# Patient Record
Sex: Male | Born: 1940 | Race: Black or African American | Hispanic: No | Smoking: Current every day smoker
Health system: Southern US, Community
[De-identification: ages and names within clinical notes are randomized; demographics above are authoritative.]

## PROBLEM LIST (undated history)

## (undated) DIAGNOSIS — I10 Essential (primary) hypertension: Secondary | ICD-10-CM

## (undated) DIAGNOSIS — E119 Type 2 diabetes mellitus without complications: Secondary | ICD-10-CM

## (undated) DIAGNOSIS — N289 Disorder of kidney and ureter, unspecified: Secondary | ICD-10-CM

## (undated) HISTORY — PX: BELOW KNEE LEG AMPUTATION: SUR23

## (undated) HISTORY — PX: NEPHRECTOMY TRANSPLANTED ORGAN: SUR880

---

## 1999-04-11 ENCOUNTER — Encounter: Payer: Self-pay | Admitting: Emergency Medicine

## 1999-04-12 ENCOUNTER — Inpatient Hospital Stay (HOSPITAL_COMMUNITY): Admission: EM | Admit: 1999-04-12 | Discharge: 1999-04-12 | Payer: Self-pay | Admitting: Emergency Medicine

## 1999-04-23 ENCOUNTER — Encounter: Admission: RE | Admit: 1999-04-23 | Discharge: 1999-04-23 | Payer: Self-pay | Admitting: Family Medicine

## 2010-05-25 ENCOUNTER — Ambulatory Visit: Payer: Self-pay | Admitting: Diagnostic Radiology

## 2010-05-25 ENCOUNTER — Encounter: Payer: Self-pay | Admitting: Emergency Medicine

## 2010-05-25 ENCOUNTER — Inpatient Hospital Stay (HOSPITAL_COMMUNITY): Admission: AD | Admit: 2010-05-25 | Discharge: 2010-06-01 | Payer: Self-pay | Admitting: Internal Medicine

## 2010-05-26 ENCOUNTER — Encounter (INDEPENDENT_AMBULATORY_CARE_PROVIDER_SITE_OTHER): Payer: Self-pay | Admitting: Internal Medicine

## 2010-05-29 ENCOUNTER — Encounter (INDEPENDENT_AMBULATORY_CARE_PROVIDER_SITE_OTHER): Payer: Self-pay | Admitting: Internal Medicine

## 2010-06-01 ENCOUNTER — Ambulatory Visit: Payer: Self-pay | Admitting: Vascular Surgery

## 2010-06-08 ENCOUNTER — Ambulatory Visit (HOSPITAL_COMMUNITY)
Admission: RE | Admit: 2010-06-08 | Discharge: 2010-06-08 | Payer: Self-pay | Source: Home / Self Care | Admitting: Vascular Surgery

## 2010-06-13 ENCOUNTER — Ambulatory Visit (HOSPITAL_COMMUNITY)
Admission: RE | Admit: 2010-06-13 | Discharge: 2010-06-13 | Payer: Self-pay | Source: Home / Self Care | Attending: Vascular Surgery | Admitting: Vascular Surgery

## 2010-06-18 ENCOUNTER — Ambulatory Visit: Payer: Self-pay | Admitting: Surgery

## 2010-06-21 ENCOUNTER — Ambulatory Visit: Payer: Self-pay | Admitting: Vascular Surgery

## 2010-06-28 ENCOUNTER — Ambulatory Visit: Payer: Self-pay | Admitting: Vascular Surgery

## 2010-07-19 ENCOUNTER — Ambulatory Visit
Admission: RE | Admit: 2010-07-19 | Discharge: 2010-07-19 | Payer: Self-pay | Source: Home / Self Care | Attending: Vascular Surgery | Admitting: Vascular Surgery

## 2010-07-24 ENCOUNTER — Ambulatory Visit (HOSPITAL_COMMUNITY)
Admission: RE | Admit: 2010-07-24 | Discharge: 2010-07-24 | Payer: Self-pay | Source: Home / Self Care | Attending: Vascular Surgery | Admitting: Vascular Surgery

## 2010-07-25 LAB — POCT I-STAT 4, (NA,K, GLUC, HGB,HCT)
Glucose, Bld: 329 mg/dL — ABNORMAL HIGH (ref 70–99)
HCT: 39 % (ref 39.0–52.0)
Hemoglobin: 13.3 g/dL (ref 13.0–17.0)
Potassium: 4 mEq/L (ref 3.5–5.1)
Sodium: 138 mEq/L (ref 135–145)

## 2010-07-25 LAB — GLUCOSE, CAPILLARY
Glucose-Capillary: 295 mg/dL — ABNORMAL HIGH (ref 70–99)
Glucose-Capillary: 236 mg/dL — ABNORMAL HIGH (ref 70–99)
Glucose-Capillary: 221 mg/dL — ABNORMAL HIGH (ref 70–99)

## 2010-07-25 LAB — SURGICAL PCR SCREEN
MRSA, PCR: NEGATIVE
Staphylococcus aureus: NEGATIVE

## 2010-08-09 ENCOUNTER — Ambulatory Visit: Admit: 2010-08-09 | Payer: Self-pay | Admitting: Vascular Surgery

## 2010-08-09 ENCOUNTER — Ambulatory Visit (INDEPENDENT_AMBULATORY_CARE_PROVIDER_SITE_OTHER): Payer: No Typology Code available for payment source | Admitting: Vascular Surgery

## 2010-08-09 ENCOUNTER — Ambulatory Visit: Payer: Self-pay | Admitting: Vascular Surgery

## 2010-08-09 DIAGNOSIS — I739 Peripheral vascular disease, unspecified: Secondary | ICD-10-CM

## 2010-08-10 NOTE — Op Note (Signed)
  NAME:  RIDGELY, ANASTACIO NO.:  [id number]  MEDICAL RECORD NO.:  [id number]          PATIENT TYPE:  AMB  LOCATION:  SDS                          FACILITY:  [facility]  PHYSICIAN:  [name]. Lashanta Elbe, MD  DATE OF BIRTH:  12-28-1940  DATE OF PROCEDURE:  07/24/2010 DATE OF DISCHARGE:  07/24/2010                              OPERATIVE REPORT   PROCEDURES: 1. 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 2. Completion amputation of fourth metatarsal left foot with foot     debridement.  PREOPERATIVE DIAGNOSIS:  Gangrene left foot with poorly-healing wound.  POSTOPERATIVE DIAGNOSIS:  Gangrene left foot with poorly-healing wound.  ANESTHESIA:  Spinal.  ASSISTANT:  Nurse.  OPERATIVE FINDINGS:  Necrotic tissue, left foot.  SPECIMENS:  Toes 2 and 3 left foot.  OPERATIVE DETAILS:  After obtaining informed consent, the patient was taken to the operating room.  The patient was placed in a supine position on the operating table.  After placement of a spinal anesthetic by the Anesthesia Team, the patient was placed in a supine position on the operating room table.  The patient's entire left foot was prepped and draped in usual sterile fashion.  An elliptical incision was made encompassing the second and third toes right at the proximal metatarsophalangeal junction.  The incision was extended through the joint space and the toe was completely transected.  These were passed off the table as a specimen.  The metatarsal was then transected approximately midshaft on both of these toes to get back to good decent bone tissue and a well-perfused portion of the foot.  There was good bleeding tissue from this location.  There was also still necrotic bone in the wound from the old fourth metatarsal and this was removed with rongeurs.  All the dead necrotic tissue was removed completely until there was good bleeding tissue.  At this point, the wound was thoroughly irrigated  with normal saline solution.  The deep layers of the foot were used to cover the bony tissues using interrupted 3-0 nylon sutures.  Skin was left open to be packed.  The patient tolerated the procedure well and there were no complications.  Sponge and needle counts were correct at the end of the case.  The patient was taken to the recovery room in a stable condition.     [name]. Eryn Marandola, MD     CEF/MEDQ  D:  07/24/2010  T:  07/25/2010  Job:  [id number]  Electronically Signed by Fabienne Bruns MD on 08/10/2010 01:06:56 PM

## 2010-08-20 NOTE — Assessment & Plan Note (Signed)
OFFICE VISIT  Jesus Estrada, Jesus Estrada DOB:  [birth year]                                       08/09/2010 EAVWU#:[id number]  The patient returns for follow-up today.  He recently underwent completion transmetatarsal amputation on January 17th and returns today for further follow-up.  He states that he did not have any problems but apparently his home health nurse or family thinks he may have fallen and injured his transmet recently.  He states overall he feels well.  PHYSICAL EXAMINATION:  Blood pressure is 141/74 in the left arm, heart rate is 98, temperature is 99.7.  There is a large amount of fibrinous debris in the left foot.  There is mild edema. The left foot was debrided back to healthy bleeding tissue again today.  There were no obvious pus pockets.  There is no erythema in the foot.  The patient's transmetatarsal amputation is still fairly marginal but still does seem to have some wound healing potential and did bleed easily, suggesting that there is reasonable perfusion.  I believe the best option for now is continued wound care which we will change to [facility] once daily for some enzymatic debridement, and he will follow up with me in 3 weeks' time.  If the wound continues to deteriorate over time, he would most likely end up with a left below knee or above knee amputation.  Hopefully the wound will continue to heal with local measures.    [name]. Fields, MD Electronically Signed  CEF/MEDQ  D:  08/09/2010  T:  08/10/2010  Job:  [phone]

## 2010-08-24 ENCOUNTER — Ambulatory Visit: Payer: No Typology Code available for payment source

## 2010-08-27 ENCOUNTER — Ambulatory Visit (INDEPENDENT_AMBULATORY_CARE_PROVIDER_SITE_OTHER): Payer: No Typology Code available for payment source | Admitting: Surgery

## 2010-08-27 DIAGNOSIS — I739 Peripheral vascular disease, unspecified: Secondary | ICD-10-CM

## 2010-08-27 DIAGNOSIS — L98499 Non-pressure chronic ulcer of skin of other sites with unspecified severity: Secondary | ICD-10-CM

## 2010-08-28 NOTE — Assessment & Plan Note (Signed)
OFFICE VISIT  Jesus Estrada, Jesus Estrada DOB:  [birth year]                                       08/27/2010 ZOXWR#:[id number]  The patient comes back in today because he was told that there was a foul odor coming from his transmetatarsal amputation which was done on February 17 by Dr. Darrick Penna.  On my inspection, the foot does have a foul odor to it.  However, there is beefy red granulation tissue.  There are multiple previous nylon sutures there that I removed.  He did have a fair amount of eschar on the plantar aspect of his wound.  I used scissors and sharply debrided all of the eschar away.  I did get back to healthy bleeding.  I did not encounter any frank pus but there was a stringent odor.  I wanted to place the patient in a wound VAC, however, with the odor I was somewhat reluctant and therefore I have recommended that we do wet-to-dry dressing changes for the next couple of weeks.  He is going to see Dr. Darrick Penna in 2 weeks.  At that time he may be a candidate for a wound VAC.    Jesus Ny, MD Electronically Signed  VWB/MEDQ  D:  08/27/2010  T:  08/28/2010  Job:  [id number]  cc:   [name]. Darrick Penna, MD

## 2010-09-06 ENCOUNTER — Ambulatory Visit (INDEPENDENT_AMBULATORY_CARE_PROVIDER_SITE_OTHER): Payer: No Typology Code available for payment source | Admitting: Vascular Surgery

## 2010-09-06 DIAGNOSIS — I70219 Atherosclerosis of native arteries of extremities with intermittent claudication, unspecified extremity: Secondary | ICD-10-CM

## 2010-09-07 NOTE — Assessment & Plan Note (Signed)
OFFICE VISIT  Jesus Estrada, Jesus Estrada DOB:  09/08/1940                                       09/06/2010 [id number]#:[id number]  The patient returns for followup today.  He previously underwent a transmetatarsal amputation of a portion of his left foot on January 17th.  He returns today for further followup and wound care evaluation. On physical exam today, blood pressure 151/85 in the left arm, heart rate 96 and regular, and temperature is 98.9.  The left foot has granulation tissue over approximately 75% of the open wound.  The wound is approximately 10 x 4 cm in diameter.  All of this was debrided back to healthy bleeding tissue today.  The wound is still not quite clean enough for a VAC.  He will follow up in 2 weeks' time for either further debridement or consideration of VAC placement.  He was also given a prescription today for physical therapy evaluation.    [name]. Fransisco Messmer, MD Electronically Signed  CEF/MEDQ  D:  09/06/2010  T:  09/07/2010  Job:  [phone]

## 2010-09-18 LAB — CULTURE, BLOOD (ROUTINE X 2)
Culture  Setup Time: 201111190200
Culture  Setup Time: 201111190202
Culture: NO GROWTH
Culture: NO GROWTH

## 2010-09-18 LAB — BASIC METABOLIC PANEL
BUN: 14 mg/dL (ref 6–23)
BUN: 15 mg/dL (ref 6–23)
BUN: 15 mg/dL (ref 6–23)
BUN: 18 mg/dL (ref 6–23)
CO2: 26 mEq/L (ref 19–32)
Calcium: 10.2 mg/dL (ref 8.4–10.5)
Calcium: 10.7 mg/dL — ABNORMAL HIGH (ref 8.4–10.5)
Chloride: 106 mEq/L (ref 96–112)
Chloride: 106 mEq/L (ref 96–112)
Chloride: 109 mEq/L (ref 96–112)
Creatinine, Ser: 1.05 mg/dL (ref 0.4–1.5)
Creatinine, Ser: 1.2 mg/dL (ref 0.4–1.5)
Creatinine, Ser: 1.26 mg/dL (ref 0.4–1.5)
GFR calc Af Amer: >60 mL/min (ref >60)
GFR calc Af Amer: >60 mL/min (ref >60)
GFR calc Af Amer: >60 mL/min (ref >60)
GFR calc non Af Amer: 57 mL/min — ABNORMAL LOW (ref >60)
GFR calc non Af Amer: >60 mL/min (ref >60)
GFR calc non Af Amer: >60 mL/min (ref >60)
Glucose, Bld: 101 mg/dL — ABNORMAL HIGH (ref 70–99)
Potassium: 4.1 mEq/L (ref 3.5–5.1)
Potassium: 4.1 mEq/L (ref 3.5–5.1)
Sodium: 147 mEq/L — ABNORMAL HIGH (ref 135–145)

## 2010-09-18 LAB — POCT I-STAT, CHEM 8
BUN: 19 mg/dL (ref 6–23)
Calcium, Ion: 1.37 mmol/L — ABNORMAL HIGH (ref 1.12–1.32)
Chloride: 108 mEq/L (ref 96–112)
Creatinine, Ser: 1.1 mg/dL (ref 0.4–1.5)
Glucose, Bld: 126 mg/dL — ABNORMAL HIGH (ref 70–99)
HCT: 44 % (ref 39.0–52.0)
Hemoglobin: 15 g/dL (ref 13.0–17.0)
Potassium: 3.9 mEq/L (ref 3.5–5.1)
Sodium: 144 mEq/L (ref 135–145)
TCO2: 29 mmol/L (ref 0–100)

## 2010-09-18 LAB — GLUCOSE, CAPILLARY
Glucose-Capillary: 131 mg/dL — ABNORMAL HIGH (ref 70–99)
Glucose-Capillary: 140 mg/dL — ABNORMAL HIGH (ref 70–99)
Glucose-Capillary: 148 mg/dL — ABNORMAL HIGH (ref 70–99)
Glucose-Capillary: 150 mg/dL — ABNORMAL HIGH (ref 70–99)
Glucose-Capillary: 212 mg/dL — ABNORMAL HIGH (ref 70–99)
Glucose-Capillary: 87 mg/dL (ref 70–99)
Glucose-Capillary: 143 mg/dL — ABNORMAL HIGH (ref 70–99)
Glucose-Capillary: 161 mg/dL — ABNORMAL HIGH (ref 70–99)
Glucose-Capillary: 166 mg/dL — ABNORMAL HIGH (ref 70–99)
Glucose-Capillary: 183 mg/dL — ABNORMAL HIGH (ref 70–99)
Glucose-Capillary: 185 mg/dL — ABNORMAL HIGH (ref 70–99)
Glucose-Capillary: 186 mg/dL — ABNORMAL HIGH (ref 70–99)
Glucose-Capillary: 191 mg/dL — ABNORMAL HIGH (ref 70–99)
Glucose-Capillary: 203 mg/dL — ABNORMAL HIGH (ref 70–99)
Glucose-Capillary: 216 mg/dL — ABNORMAL HIGH (ref 70–99)
Glucose-Capillary: 221 mg/dL — ABNORMAL HIGH (ref 70–99)
Glucose-Capillary: 239 mg/dL — ABNORMAL HIGH (ref 70–99)
Glucose-Capillary: 243 mg/dL — ABNORMAL HIGH (ref 70–99)
Glucose-Capillary: 247 mg/dL — ABNORMAL HIGH (ref 70–99)
Glucose-Capillary: 269 mg/dL — ABNORMAL HIGH (ref 70–99)
Glucose-Capillary: 316 mg/dL — ABNORMAL HIGH (ref 70–99)
Glucose-Capillary: 382 mg/dL — ABNORMAL HIGH (ref 70–99)
Glucose-Capillary: 60 mg/dL — ABNORMAL LOW (ref 70–99)

## 2010-09-18 LAB — DIFFERENTIAL
Basophils Absolute: 0.1 10*3/uL (ref 0.0–0.1)
Basophils Relative: 2 % — ABNORMAL HIGH (ref 0–1)
Eosinophils Absolute: 0.1 10*3/uL (ref 0.0–0.7)
Eosinophils Relative: 2 % (ref 0–5)
Lymphocytes Relative: 22 % (ref 12–46)
Lymphs Abs: 1.1 10*3/uL (ref 0.7–4.0)
Monocytes Absolute: 0.8 10*3/uL (ref 0.1–1.0)
Monocytes Relative: 16 % — ABNORMAL HIGH (ref 3–12)
Neutro Abs: 3 10*3/uL (ref 1.7–7.7)
Neutrophils Relative %: 58 % (ref 43–77)

## 2010-09-18 LAB — CBC
HCT: 37.8 % — ABNORMAL LOW (ref 39.0–52.0)
HCT: 41.5 % (ref 39.0–52.0)
Hemoglobin: 13.2 g/dL (ref 13.0–17.0)
MCH: 25.4 pg — ABNORMAL LOW (ref 26.0–34.0)
MCH: 25.9 pg — ABNORMAL LOW (ref 26.0–34.0)
MCH: 25.9 pg — ABNORMAL LOW (ref 26.0–34.0)
MCHC: 31.9 g/dL (ref 30.0–36.0)
MCHC: 32.6 g/dL (ref 30.0–36.0)
MCV: 79 fL (ref 78.0–100.0)
MCV: 79.4 fL (ref 78.0–100.0)
MCV: 79.6 fL (ref 78.0–100.0)
MCV: 79.6 fL (ref 78.0–100.0)
Platelets: 242 10*3/uL (ref 150–400)
Platelets: 251 10*3/uL (ref 150–400)
Platelets: 256 10*3/uL (ref 150–400)
Platelets: 261 10*3/uL (ref 150–400)
Platelets: 289 10*3/uL (ref 150–400)
RBC: 4.75 MIL/uL (ref 4.22–5.81)
RBC: 4.76 MIL/uL (ref 4.22–5.81)
RBC: 5.22 MIL/uL (ref 4.22–5.81)
RDW: 14.9 % (ref 11.5–15.5)
RDW: 15.2 % (ref 11.5–15.5)
RDW: 15.3 % (ref 11.5–15.5)
RDW: 15.4 % (ref 11.5–15.5)
RDW: 15.5 % (ref 11.5–15.5)
WBC: 5.1 10*3/uL (ref 4.0–10.5)
WBC: 5.4 10*3/uL (ref 4.0–10.5)
WBC: 5.9 10*3/uL (ref 4.0–10.5)
WBC: 6.4 10*3/uL (ref 4.0–10.5)

## 2010-09-18 LAB — WOUND CULTURE: Gram Stain: NONE SEEN

## 2010-09-18 LAB — COMPREHENSIVE METABOLIC PANEL
ALT: 9 U/L (ref 0–53)
AST: 15 U/L (ref 0–37)
Albumin: 2.7 g/dL — ABNORMAL LOW (ref 3.5–5.2)
CO2: 24 mEq/L (ref 19–32)
Calcium: 9.9 mg/dL (ref 8.4–10.5)
GFR calc Af Amer: >60 mL/min (ref >60)
GFR calc non Af Amer: 60 mL/min — ABNORMAL LOW (ref >60)
Sodium: 138 mEq/L (ref 135–145)
Total Protein: 8 g/dL (ref 6.0–8.3)

## 2010-09-18 LAB — VANCOMYCIN, TROUGH
Vancomycin Tr: 19.8 ug/mL (ref 10.0–20.0)
Vancomycin Tr: 23.4 ug/mL — ABNORMAL HIGH (ref 10.0–20.0)

## 2010-09-18 LAB — CREATININE, SERUM
Creatinine, Ser: 1.15 mg/dL (ref 0.4–1.5)
GFR calc Af Amer: >60 mL/min (ref >60)
GFR calc non Af Amer: >60 mL/min (ref >60)

## 2010-09-18 LAB — LIPID PANEL
Cholesterol: 161 mg/dL (ref 0–200)
HDL: 14 mg/dL — ABNORMAL LOW (ref >39)
LDL Cholesterol: 123 mg/dL — ABNORMAL HIGH (ref 0–99)
Total CHOL/HDL Ratio: 11.5 RATIO
Triglycerides: 120 mg/dL (ref <150)

## 2010-09-18 LAB — SURGICAL PCR SCREEN
MRSA, PCR: NEGATIVE
Staphylococcus aureus: NEGATIVE

## 2010-09-18 LAB — POCT I-STAT 4, (NA,K, GLUC, HGB,HCT)
Glucose, Bld: 155 mg/dL — ABNORMAL HIGH (ref 70–99)
HCT: 44 % (ref 39.0–52.0)
Hemoglobin: 15 g/dL (ref 13.0–17.0)
Potassium: 3.8 mEq/L (ref 3.5–5.1)
Sodium: 142 mEq/L (ref 135–145)

## 2010-09-18 LAB — SEDIMENTATION RATE: Sed Rate: 36 mm/hr — ABNORMAL HIGH (ref 0–16)

## 2010-09-18 LAB — TSH: TSH: 0.576 u[IU]/mL (ref 0.350–4.500)

## 2010-09-20 ENCOUNTER — Ambulatory Visit (INDEPENDENT_AMBULATORY_CARE_PROVIDER_SITE_OTHER): Payer: No Typology Code available for payment source | Admitting: Vascular Surgery

## 2010-09-20 DIAGNOSIS — I70219 Atherosclerosis of native arteries of extremities with intermittent claudication, unspecified extremity: Secondary | ICD-10-CM

## 2010-09-21 NOTE — Assessment & Plan Note (Signed)
OFFICE VISIT  CHEYNE, BOULDEN DOB:  [birth year]                                       09/20/2010 [id number]#:[id number]  The patient returns for followup today for further evaluation of his transmetatarsal amputation on the left foot.  He is currently doing daily dressing changes on this.  He has had multiple debridements of this.  PHYSICAL EXAM:  Today blood pressure is 136/83 in the left arm, heart rate is 48 and regular.  Left transmetatarsal amputation still had a large amount of fibrinous exudate covering this.  This was all sharply debrided without the use of anesthetic as he is insensate in his left foot.  This was debrided back to healthy bleeding tissue.  Hopefully the wound will continue to contract over time.  He will continue to do local wound care on the left transmetatarsal amputation. He will follow up with me in 2 weeks' time.  He is to continue to work with physical therapy on standing.  I also gave him a prescription today for evaluation by the prosthetist for a possible right leg prosthetic.    [name]. Sharmaine Bain, MD Electronically Signed  CEF/MEDQ  D:  09/20/2010  T:  09/21/2010  Job:  [phone]

## 2010-10-04 ENCOUNTER — Ambulatory Visit (INDEPENDENT_AMBULATORY_CARE_PROVIDER_SITE_OTHER): Payer: No Typology Code available for payment source | Admitting: Vascular Surgery

## 2010-10-04 DIAGNOSIS — I739 Peripheral vascular disease, unspecified: Secondary | ICD-10-CM

## 2010-10-04 DIAGNOSIS — L98499 Non-pressure chronic ulcer of skin of other sites with unspecified severity: Secondary | ICD-10-CM

## 2010-10-05 NOTE — Assessment & Plan Note (Signed)
OFFICE VISIT  Jesus Estrada, Jesus Estrada DOB:  May 09, 1941                                       10/04/2010 [id number]#:[id number]  The patient returns for followup today for an open transmetatarsal amputation.  This was done in January of 2012.  We have been doing serial debridements of the wound in order to assist with wound healing.  PHYSICAL EXAM:  Today blood pressure is 116/73 in the left arm, heart rate 67 and regular, respirations 20.  Left foot has an 11 cm x 2 cm open transmetatarsal amputation wound.  Most of this is granulating well approximately 90%.  This was debrided sharply again today and there was good bleeding tissue.  All the fibrinous exudate was removed.  He will follow up in 2 weeks' time for reevaluation of his foot wound.    [name]. Fields, MD Electronically Signed  CEF/MEDQ  D:  10/04/2010  T:  10/05/2010  Job:  [phone]

## 2010-10-25 ENCOUNTER — Ambulatory Visit (INDEPENDENT_AMBULATORY_CARE_PROVIDER_SITE_OTHER): Payer: No Typology Code available for payment source | Admitting: Vascular Surgery

## 2010-10-25 DIAGNOSIS — I739 Peripheral vascular disease, unspecified: Secondary | ICD-10-CM

## 2010-10-26 NOTE — Assessment & Plan Note (Signed)
OFFICE VISIT  [name], [name] DOB:  14-May-1941                                       10/25/2010 UUVOZ#:[id number]  The patient returns for followup today after transmetatarsal amputation in January of 2012.  We have been doing serial debridements on his foot. He returns today for further followup.  He has no pain in the foot.  He has had no fever or chills.  PHYSICAL EXAM:  Today blood pressure is 107/73 in the left arm, heart rate is 51 and regular.  Temperature is 97.4.  He has a 9 cm x 2 cm open wound less than 2 mm in depth on the left lateral foot.  There is good healthy-appearing granulation tissue and no significant debridement was performed today.  Overall the patient's wound appears clean at this point and continues to heal.  It is too superficial to consider a VAC at this point.  Hopefully it will continue to heal with local wound care.  He will follow up in 3- 4 weeks.    [name]. Fields, MD Electronically Signed  CEF/MEDQ  D:  10/25/2010  T:  10/26/2010  Job:  [phone]

## 2010-11-12 ENCOUNTER — Emergency Department (HOSPITAL_BASED_OUTPATIENT_CLINIC_OR_DEPARTMENT_OTHER)
Admission: EM | Admit: 2010-11-12 | Discharge: 2010-11-12 | Disposition: A | Discharge status: Home / Self Care | Payer: No Typology Code available for payment source | Source: Home / Self Care | Attending: Emergency Medicine | Admitting: Emergency Medicine

## 2010-11-12 ENCOUNTER — Emergency Department (INDEPENDENT_AMBULATORY_CARE_PROVIDER_SITE_OTHER): Payer: No Typology Code available for payment source

## 2010-11-12 ENCOUNTER — Inpatient Hospital Stay (HOSPITAL_COMMUNITY)
Admission: AD | Admit: 2010-11-12 | Discharge: 2010-11-19 | Disposition: A | Discharge status: Skilled Nursing Facility | Payer: No Typology Code available for payment source | Source: Other Acute Inpatient Hospital | Attending: Internal Medicine | Admitting: Internal Medicine

## 2010-11-12 DIAGNOSIS — I4892 Unspecified atrial flutter: Secondary | ICD-10-CM | POA: Diagnosis present

## 2010-11-12 DIAGNOSIS — M9080 Osteopathy in diseases classified elsewhere, unspecified site: Secondary | ICD-10-CM | POA: Diagnosis present

## 2010-11-12 DIAGNOSIS — S88119A Complete traumatic amputation at level between knee and ankle, unspecified lower leg, initial encounter: Secondary | ICD-10-CM

## 2010-11-12 DIAGNOSIS — G319 Degenerative disease of nervous system, unspecified: Secondary | ICD-10-CM | POA: Insufficient documentation

## 2010-11-12 DIAGNOSIS — N179 Acute kidney failure, unspecified: Secondary | ICD-10-CM | POA: Diagnosis not present

## 2010-11-12 DIAGNOSIS — B961 Klebsiella pneumoniae [K. pneumoniae] as the cause of diseases classified elsewhere: Secondary | ICD-10-CM | POA: Diagnosis not present

## 2010-11-12 DIAGNOSIS — R5381 Other malaise: Secondary | ICD-10-CM | POA: Diagnosis present

## 2010-11-12 DIAGNOSIS — I10 Essential (primary) hypertension: Secondary | ICD-10-CM | POA: Diagnosis present

## 2010-11-12 DIAGNOSIS — Y835 Amputation of limb(s) as the cause of abnormal reaction of the patient, or of later complication, without mention of misadventure at the time of the procedure: Secondary | ICD-10-CM

## 2010-11-12 DIAGNOSIS — E1169 Type 2 diabetes mellitus with other specified complication: Secondary | ICD-10-CM | POA: Diagnosis present

## 2010-11-12 DIAGNOSIS — Z7982 Long term (current) use of aspirin: Secondary | ICD-10-CM

## 2010-11-12 DIAGNOSIS — D509 Iron deficiency anemia, unspecified: Secondary | ICD-10-CM | POA: Diagnosis present

## 2010-11-12 DIAGNOSIS — I739 Peripheral vascular disease, unspecified: Secondary | ICD-10-CM | POA: Diagnosis present

## 2010-11-12 DIAGNOSIS — I70209 Unspecified atherosclerosis of native arteries of extremities, unspecified extremity: Secondary | ICD-10-CM | POA: Diagnosis present

## 2010-11-12 DIAGNOSIS — Z7901 Long term (current) use of anticoagulants: Secondary | ICD-10-CM

## 2010-11-12 DIAGNOSIS — Z940 Kidney transplant status: Secondary | ICD-10-CM

## 2010-11-12 DIAGNOSIS — M869 Osteomyelitis, unspecified: Secondary | ICD-10-CM | POA: Diagnosis present

## 2010-11-12 DIAGNOSIS — M7989 Other specified soft tissue disorders: Secondary | ICD-10-CM | POA: Insufficient documentation

## 2010-11-12 DIAGNOSIS — I4891 Unspecified atrial fibrillation: Secondary | ICD-10-CM | POA: Diagnosis present

## 2010-11-12 DIAGNOSIS — N390 Urinary tract infection, site not specified: Secondary | ICD-10-CM | POA: Diagnosis not present

## 2010-11-12 DIAGNOSIS — E119 Type 2 diabetes mellitus without complications: Secondary | ICD-10-CM | POA: Insufficient documentation

## 2010-11-12 DIAGNOSIS — D72829 Elevated white blood cell count, unspecified: Secondary | ICD-10-CM | POA: Diagnosis present

## 2010-11-12 DIAGNOSIS — F17200 Nicotine dependence, unspecified, uncomplicated: Secondary | ICD-10-CM | POA: Diagnosis present

## 2010-11-12 DIAGNOSIS — T8740 Infection of amputation stump, unspecified extremity: Secondary | ICD-10-CM

## 2010-11-12 DIAGNOSIS — Z794 Long term (current) use of insulin: Secondary | ICD-10-CM

## 2010-11-12 DIAGNOSIS — IMO0002 Reserved for concepts with insufficient information to code with codable children: Secondary | ICD-10-CM

## 2010-11-12 LAB — COMPREHENSIVE METABOLIC PANEL
BUN: 31 mg/dL — ABNORMAL HIGH (ref 6–23)
CO2: 20 mEq/L (ref 19–32)
Chloride: 94 mEq/L — ABNORMAL LOW (ref 96–112)
Creatinine, Ser: 1.6 mg/dL — ABNORMAL HIGH (ref 0.4–1.5)
GFR calc non Af Amer: 43 mL/min — ABNORMAL LOW (ref >60)
Total Bilirubin: 0.4 mg/dL (ref 0.3–1.2)

## 2010-11-12 LAB — URINALYSIS, ROUTINE W REFLEX MICROSCOPIC
Glucose, UA: 500 mg/dL — AB
Ketones, ur: NEGATIVE mg/dL
Protein, ur: 100 mg/dL — AB

## 2010-11-12 LAB — CBC
Hemoglobin: 13.2 g/dL (ref 13.0–17.0)
MCH: 23.8 pg — ABNORMAL LOW (ref 26.0–34.0)
MCHC: 32.8 g/dL (ref 30.0–36.0)
Platelets: 315 10*3/uL (ref 150–400)

## 2010-11-12 LAB — CK TOTAL AND CKMB (NOT AT ARMC)
CK, MB: 1.6 ng/mL (ref 0.3–4.0)
Total CK: 93 U/L (ref 7–232)

## 2010-11-12 LAB — DIFFERENTIAL
Basophils Absolute: 0 10*3/uL (ref 0.0–0.1)
Eosinophils Absolute: 0.1 10*3/uL (ref 0.0–0.7)
Lymphs Abs: 1 10*3/uL (ref 0.7–4.0)
Monocytes Absolute: 1.1 10*3/uL — ABNORMAL HIGH (ref 0.1–1.0)
Neutro Abs: 6.5 10*3/uL (ref 1.7–7.7)

## 2010-11-12 LAB — GLUCOSE, CAPILLARY

## 2010-11-12 LAB — URINE MICROSCOPIC-ADD ON

## 2010-11-13 LAB — CBC
HCT: 37.6 % — ABNORMAL LOW (ref 39.0–52.0)
Hemoglobin: 11.8 g/dL — ABNORMAL LOW (ref 13.0–17.0)
MCH: 23.6 pg — ABNORMAL LOW (ref 26.0–34.0)
MCHC: 31.4 g/dL (ref 30.0–36.0)
RDW: 15.8 % — ABNORMAL HIGH (ref 11.5–15.5)

## 2010-11-13 LAB — DIFFERENTIAL
Basophils Absolute: 0 10*3/uL (ref 0.0–0.1)
Basophils Relative: 0 % (ref 0–1)
Eosinophils Relative: 1 % (ref 0–5)
Lymphocytes Relative: 10 % — ABNORMAL LOW (ref 12–46)
Monocytes Absolute: 1.2 10*3/uL — ABNORMAL HIGH (ref 0.1–1.0)
Monocytes Relative: 12 % (ref 3–12)

## 2010-11-13 LAB — GLUCOSE, CAPILLARY
Glucose-Capillary: 229 mg/dL — ABNORMAL HIGH (ref 70–99)
Glucose-Capillary: 306 mg/dL — ABNORMAL HIGH (ref 70–99)

## 2010-11-13 LAB — LIPID PANEL
Cholesterol: 120 mg/dL (ref 0–200)
HDL: 17 mg/dL — ABNORMAL LOW (ref >39)
LDL Cholesterol: 82 mg/dL (ref 0–99)
Total CHOL/HDL Ratio: 7.1 RATIO
Triglycerides: 106 mg/dL (ref <150)
VLDL: 21 mg/dL (ref 0–40)

## 2010-11-13 LAB — COMPREHENSIVE METABOLIC PANEL
ALT: 5 U/L (ref 0–53)
BUN: 25 mg/dL — ABNORMAL HIGH (ref 6–23)
CO2: 23 mEq/L (ref 19–32)
Calcium: 10.4 mg/dL (ref 8.4–10.5)
Creatinine, Ser: 1.53 mg/dL — ABNORMAL HIGH (ref 0.4–1.5)
GFR calc non Af Amer: 46 mL/min — ABNORMAL LOW (ref >60)
Glucose, Bld: 191 mg/dL — ABNORMAL HIGH (ref 70–99)
Sodium: 132 mEq/L — ABNORMAL LOW (ref 135–145)
Total Protein: 8.7 g/dL — ABNORMAL HIGH (ref 6.0–8.3)

## 2010-11-13 LAB — T4, FREE: Free T4: 1.08 ng/dL (ref 0.80–1.80)

## 2010-11-13 LAB — TSH: TSH: 1.355 u[IU]/mL (ref 0.350–4.500)

## 2010-11-13 LAB — MAGNESIUM: Magnesium: 2.1 mg/dL (ref 1.5–2.5)

## 2010-11-13 LAB — PHOSPHORUS: Phosphorus: 2.1 mg/dL — ABNORMAL LOW (ref 2.3–4.6)

## 2010-11-14 LAB — GLUCOSE, CAPILLARY
Glucose-Capillary: 367 mg/dL — ABNORMAL HIGH (ref 70–99)
Glucose-Capillary: 153 mg/dL — ABNORMAL HIGH (ref 70–99)

## 2010-11-14 LAB — PROTEIN ELECTROPH W RFLX QUANT IMMUNOGLOBULINS
Alpha-1-Globulin: 5.5 % — ABNORMAL HIGH (ref 2.9–4.9)
Alpha-2-Globulin: 11.3 % (ref 7.1–11.8)
M-Spike, %: NOT DETECTED g/dL
Total Protein ELP: 9.2 g/dL — ABNORMAL HIGH (ref 6.0–8.3)

## 2010-11-14 LAB — URINE CULTURE
Colony Count: >=100000
Culture  Setup Time: 201205080705

## 2010-11-14 LAB — BASIC METABOLIC PANEL
BUN: 21 mg/dL (ref 6–23)
CO2: 22 mEq/L (ref 19–32)
Glucose, Bld: 86 mg/dL (ref 70–99)
Potassium: 5 mEq/L (ref 3.5–5.1)
Sodium: 131 mEq/L — ABNORMAL LOW (ref 135–145)

## 2010-11-14 LAB — IGG, IGA, IGM
IgA: 1300 mg/dL — ABNORMAL HIGH (ref 68–378)
IgG (Immunoglobin G), Serum: 3520 mg/dL — ABNORMAL HIGH (ref 700–1600)

## 2010-11-14 LAB — UIFE/LIGHT CHAINS/TP QN, 24-HR UR
Alpha 2, Urine: DETECTED — AB
Free Kappa Lt Chains,Ur: 80.3 mg/dL — ABNORMAL HIGH (ref 0.04–1.51)
Free Lambda Lt Chains,Ur: 7.92 mg/dL — ABNORMAL HIGH (ref 0.08–1.01)
Total Protein, Urine: 102.1 mg/dL

## 2010-11-14 LAB — MAGNESIUM: Magnesium: 2.1 mg/dL (ref 1.5–2.5)

## 2010-11-14 LAB — POTASSIUM: Potassium: 3.5 mEq/L (ref 3.5–5.1)

## 2010-11-15 LAB — WOUND CULTURE: Gram Stain: NONE SEEN

## 2010-11-15 LAB — IRON AND TIBC
Saturation Ratios: 13 % — ABNORMAL LOW (ref 20–55)
TIBC: 162 ug/dL — ABNORMAL LOW (ref 215–435)
UIBC: 141 ug/dL

## 2010-11-15 LAB — GLUCOSE, CAPILLARY
Glucose-Capillary: 201 mg/dL — ABNORMAL HIGH (ref 70–99)
Glucose-Capillary: 241 mg/dL — ABNORMAL HIGH (ref 70–99)

## 2010-11-15 LAB — PROTIME-INR: INR: 1.14 (ref 0.00–1.49)

## 2010-11-15 LAB — CBC
Hemoglobin: 9.8 g/dL — ABNORMAL LOW (ref 13.0–17.0)
MCH: 22.7 pg — ABNORMAL LOW (ref 26.0–34.0)
RBC: 4.32 MIL/uL (ref 4.22–5.81)
WBC: 8.4 10*3/uL (ref 4.0–10.5)

## 2010-11-15 LAB — BASIC METABOLIC PANEL
CO2: 21 mEq/L (ref 19–32)
Chloride: 108 mEq/L (ref 96–112)
GFR calc Af Amer: >60 mL/min (ref >60)
Potassium: 3.9 mEq/L (ref 3.5–5.1)
Sodium: 135 mEq/L (ref 135–145)

## 2010-11-15 LAB — TACROLIMUS, BLOOD: Tacrolimus Lvl: 5.7 ng/mL (ref 5.0–20.0)

## 2010-11-15 LAB — APTT: aPTT: 26 seconds (ref 24–37)

## 2010-11-15 LAB — HEPARIN LEVEL (UNFRACTIONATED): Heparin Unfractionated: 0.29 IU/mL — ABNORMAL LOW (ref 0.30–0.70)

## 2010-11-16 ENCOUNTER — Inpatient Hospital Stay (HOSPITAL_COMMUNITY): Payer: No Typology Code available for payment source

## 2010-11-16 LAB — DIFFERENTIAL
Eosinophils Absolute: 0.1 10*3/uL (ref 0.0–0.7)
Lymphocytes Relative: 14 % (ref 12–46)
Lymphs Abs: 1.1 10*3/uL (ref 0.7–4.0)
Neutro Abs: 6.2 10*3/uL (ref 1.7–7.7)
Neutrophils Relative %: 75 % (ref 43–77)

## 2010-11-16 LAB — CBC
Hemoglobin: 9.8 g/dL — ABNORMAL LOW (ref 13.0–17.0)
MCV: 76.6 fL — ABNORMAL LOW (ref 78.0–100.0)
Platelets: 287 10*3/uL (ref 150–400)
RBC: 4.19 MIL/uL — ABNORMAL LOW (ref 4.22–5.81)
WBC: 8.3 10*3/uL (ref 4.0–10.5)

## 2010-11-16 LAB — BASIC METABOLIC PANEL
GFR calc non Af Amer: >60 mL/min (ref >60)
Glucose, Bld: 230 mg/dL — ABNORMAL HIGH (ref 70–99)
Potassium: 4.3 mEq/L (ref 3.5–5.1)
Sodium: 137 mEq/L (ref 135–145)

## 2010-11-16 LAB — FERRITIN: Ferritin: 297 ng/mL (ref 22–322)

## 2010-11-16 LAB — GLUCOSE, CAPILLARY
Glucose-Capillary: 203 mg/dL — ABNORMAL HIGH (ref 70–99)
Glucose-Capillary: 228 mg/dL — ABNORMAL HIGH (ref 70–99)

## 2010-11-16 NOTE — Consult Note (Addendum)
  NAME:  Jesus, Estrada NO.:  [id number]  MEDICAL RECORD NO.:  [id number]           PATIENT TYPE:  I  LOCATION:  1233                         FACILITY:  [facility]  PHYSICIAN:  Mills Mitton C. Lowell Guitar, M.D.  DATE OF BIRTH:  [birth year]  DATE OF CONSULTATION: DATE OF DISCHARGE:                                CONSULTATION   I was asked by Dr. Delsa Grana to see this 70 year old male, status post kidney transplant in 2008 at [facility] who is admitted with foot infection.  The patient is status post right below- knee amputation in the past.  He was felt to have osteomyelitis of the left foot.  Serum creatinine on admission was 1.60 mg/dL and serum creatinine in November 2011 was 1.07 mg/dL.  The patient is on triple immunosuppressive therapy including prednisone, Prograf, CellCept.  The patient was also found to have atrial fibrillation with rapid ventricular response and urinary tract infection.  Nephrology consultation was requested to assist with management.  Prograf level on admission was low at less than 2.0 ng/mL.  PAST MEDICAL HISTORY: 1. Hypertension. 2. Diabetes. 3. 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 4. Peripheral vascular disease, status post right below-knee     amputation, multiple toe amputations of the left foot.  SOCIAL HISTORY:  Longstanding history of cigarette smoking, does not consume alcohol.  Lives alone, wife died in Jun 20, 2007.  CURRENT MEDICATIONS:  Aspirin, Lovenox, gabapentin, insulin, metoprolol, Remeron, CellCept 750 mg b.i.d., piperacillin, prednisone 5 mg daily, Crestor, Prograf 3.5 mg b.i.d., vancomycin, and Xopenex.  REVIEW OF SYSTEMS:  Essentially noncontributory except for as outlined already in the history of present illness.  PHYSICAL EXAMINATION:  VITAL SIGNS:  Blood pressure is 115/71, he is afebrile, he is in room air oxygen. HEENT:  Atraumatic, normocephalic.  Extraocular movements are  intact. LUNGS:  Clear. HEART:  Regular rhythm and rate. ABDOMEN:  Soft. EXTREMITIES:  Right below-knee amputation, left foot bandage. NEUROLOGIC:  The patient is fluent in his speech.  Alert, oriented, and very talkative.  Sodium 131, potassium 5.0, chloride 103, CO2 of 22, BUN 21, creatinine 1.55.  Hemoglobin 11.8, white blood count 9700, platelet count 268,000, and Prograf less than 2.0 ng/mL.  ASSESSMENT: 1. Status post cadaveric renal transplant with acute renal     dysfunction, possibly secondary to infection. 2. Osteomyelitis of the left foot. 3. Klebsiella pneumoniae urinary tract infection.  PLAN:  Obtain true trough Prograf level in the morning, continue supportive therapy with antibiotics, local foot care          ______________________________ Mindi Slicker. Lowell Guitar, M.D.     ACP/MEDQ  D:  11/14/2010  T:  11/14/2010  Job:  [id number]  Electronically Signed by Casimiro Needle M.D. on 11/16/2010 05:16:02 PM

## 2010-11-17 LAB — BASIC METABOLIC PANEL
BUN: 16 mg/dL (ref 6–23)
CO2: 24 mEq/L (ref 19–32)
Chloride: 108 mEq/L (ref 96–112)
Creatinine, Ser: 1.1 mg/dL (ref 0.4–1.5)
Glucose, Bld: 136 mg/dL — ABNORMAL HIGH (ref 70–99)

## 2010-11-17 LAB — CBC
MCH: 22.9 pg — ABNORMAL LOW (ref 26.0–34.0)
MCV: 76.6 fL — ABNORMAL LOW (ref 78.0–100.0)
Platelets: 294 10*3/uL (ref 150–400)
RDW: 16.4 % — ABNORMAL HIGH (ref 11.5–15.5)

## 2010-11-17 LAB — GLUCOSE, CAPILLARY
Glucose-Capillary: 315 mg/dL — ABNORMAL HIGH (ref 70–99)
Glucose-Capillary: 174 mg/dL — ABNORMAL HIGH (ref 70–99)

## 2010-11-18 LAB — HEPARIN LEVEL (UNFRACTIONATED): Heparin Unfractionated: 0.23 IU/mL — ABNORMAL LOW (ref 0.30–0.70)

## 2010-11-18 LAB — POCT OCCULT BLOOD STOOL (DEVICE): Fecal Occult Bld: NEGATIVE

## 2010-11-18 LAB — CBC
HCT: 29.4 % — ABNORMAL LOW (ref 39.0–52.0)
Hemoglobin: 8.8 g/dL — ABNORMAL LOW (ref 13.0–17.0)
Hemoglobin: 9.4 g/dL — ABNORMAL LOW (ref 13.0–17.0)
MCHC: 30.2 g/dL (ref 30.0–36.0)
RDW: 16.5 % — ABNORMAL HIGH (ref 11.5–15.5)
RDW: 16.7 % — ABNORMAL HIGH (ref 11.5–15.5)
WBC: 6.9 10*3/uL (ref 4.0–10.5)
WBC: 7.1 10*3/uL (ref 4.0–10.5)

## 2010-11-18 LAB — BASIC METABOLIC PANEL
BUN: 11 mg/dL (ref 6–23)
CO2: 24 mEq/L (ref 19–32)
Calcium: 8.8 mg/dL (ref 8.4–10.5)
Creatinine, Ser: 1.03 mg/dL (ref 0.4–1.5)
Glucose, Bld: 185 mg/dL — ABNORMAL HIGH (ref 70–99)
Sodium: 139 mEq/L (ref 135–145)

## 2010-11-18 LAB — GLUCOSE, CAPILLARY
Glucose-Capillary: 166 mg/dL — ABNORMAL HIGH (ref 70–99)
Glucose-Capillary: 218 mg/dL — ABNORMAL HIGH (ref 70–99)

## 2010-11-18 LAB — PROTIME-INR: INR: 1.51 — ABNORMAL HIGH (ref 0.00–1.49)

## 2010-11-19 ENCOUNTER — Inpatient Hospital Stay (HOSPITAL_COMMUNITY)
Admission: AD | Admit: 2010-11-19 | Discharge: 2010-11-26 | DRG: 617 | Disposition: A | Discharge status: Skilled Nursing Facility | Payer: No Typology Code available for payment source | Source: Ambulatory Visit | Attending: Internal Medicine | Admitting: Internal Medicine

## 2010-11-19 LAB — DIFFERENTIAL
Basophils Absolute: 0 10*3/uL (ref 0.0–0.1)
Eosinophils Absolute: 0.2 10*3/uL (ref 0.0–0.7)
Eosinophils Relative: 3 % (ref 0–5)
Lymphocytes Relative: 22 % (ref 12–46)
Neutrophils Relative %: 63 % (ref 43–77)

## 2010-11-19 LAB — CULTURE, BLOOD (ROUTINE X 2)
Culture  Setup Time: 201205080836
Culture: NO GROWTH

## 2010-11-19 LAB — TECHNOLOGIST SMEAR REVIEW

## 2010-11-19 LAB — BASIC METABOLIC PANEL
GFR calc non Af Amer: >60 mL/min (ref >60)
Potassium: 3.6 mEq/L (ref 3.5–5.1)
Sodium: 139 mEq/L (ref 135–145)

## 2010-11-19 LAB — HEPARIN LEVEL (UNFRACTIONATED): Heparin Unfractionated: 0.31 IU/mL (ref 0.30–0.70)

## 2010-11-19 LAB — CBC
Platelets: 290 10*3/uL (ref 150–400)
RBC: 3.91 MIL/uL — ABNORMAL LOW (ref 4.22–5.81)
RDW: 16.8 % — ABNORMAL HIGH (ref 11.5–15.5)
WBC: 6.6 10*3/uL (ref 4.0–10.5)

## 2010-11-19 LAB — PROTIME-INR
INR: 2.38 — ABNORMAL HIGH (ref 0.00–1.49)
Prothrombin Time: 26.1 seconds — ABNORMAL HIGH (ref 11.6–15.2)

## 2010-11-19 LAB — GLUCOSE, CAPILLARY
Glucose-Capillary: 121 mg/dL — ABNORMAL HIGH (ref 70–99)
Glucose-Capillary: 264 mg/dL — ABNORMAL HIGH (ref 70–99)

## 2010-11-20 LAB — GLUCOSE, CAPILLARY
Glucose-Capillary: 208 mg/dL — ABNORMAL HIGH (ref 70–99)
Glucose-Capillary: 218 mg/dL — ABNORMAL HIGH (ref 70–99)

## 2010-11-20 LAB — PROTIME-INR: Prothrombin Time: 17 seconds — ABNORMAL HIGH (ref 11.6–15.2)

## 2010-11-20 NOTE — Assessment & Plan Note (Signed)
OFFICE VISIT   FREAD, KOTTKE  DOB:  10/07/1940                                       06/28/2010  ZOXWR#:[id number]   The patient returns for followup today.  He underwent debridement of his  left foot with amputation of his fifth toe on December 7.  He is  currently receiving wet-to-dry dressing changes.  On his last visit on  December 15 he had a large amount of necrotic tissue.  I discussed with  him the possibility of below knee amputation but he refused that.  He is  currently still refusing the idea of a below knee amputation.   PHYSICAL EXAM:  Today, respirations 20, blood pressure is 145/75, heart  rate is 73 and regular.  He has a well-healed right below knee  amputation.  The left foot has approximately 20% granulation tissue, 80%  fibrinous exudate.  All of this was debrided back to bleeding tissue  again today.   Again, it was discussed with the patient the possibility of a below knee  amputation on the left side but he currently is not interested in this.  He will follow up in two weeks' time.     [name]. Fields, MD  Electronically Signed   CEF/MEDQ  D:  06/28/2010  T:  06/29/2010  Job:  [phone]

## 2010-11-20 NOTE — Assessment & Plan Note (Signed)
OFFICE VISIT   Jesus Estrada, Jesus Estrada  DOB:  Apr 26, 1941                                       07/19/2010  EAVWU#:[id number]   The patient returns for followup today.  He had debridement of his left  foot amputation of his fifth toe on December 7th.  He is currently doing  wet-to-dry dressing changes.   On physical exam today:  Temperature 98.3, blood pressure 137/80, heart  rate 89.  The left foot has gangrenous changes of the fourth toe.  There  is fibrinous exudate over approximate 50% in the lateral left foot  wound.   This was all debrided back to healthy tissue today.  I believe the best  option at this point would be a completion transmetatarsal amputation on  the patient and we will do further debridement of his foot at that time.  There is some evidence that he does have healing tissue and hopefully  this wound will continue to heal over time with further debridement.  His transmetatarsal amputation is scheduled for July 24, 2010.     [name]. Fields, MD  Electronically Signed   CEF/MEDQ  D:  07/19/2010  T:  07/20/2010  Job:  [phone]

## 2010-11-20 NOTE — Assessment & Plan Note (Signed)
OFFICE VISIT   HENRI, BAUMLER  DOB:  11-05-1940                                       06/21/2010  WJXBJ#:[id number]   The patient returns for followup today.  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  He had debridement of his left foot with  amputation of his fifth toe on December 7th.  He returns today for  further followup.   PHYSICAL EXAMINATION:  Today there is a large amount of necrotic tissue  within the wound.  The wound is cool to the touch.  The foot, however,  is warm otherwise.  A large amount of necrotic tissue was debrided  sharply today in the office.  There was some venous-type bleeding but no  obvious arterial bleeding.  The wound still has necrotic and fibrinous  tissue.  I discussed with the patient today that I believe the best  option at this point would be a below knee amputation.  However, he is  resistant to this idea currently and wishes to try wound care for a  while longer.  He will continue to the b.i.d. normal saline wet-to-dry  dressings.  He will follow up in 1 week's time.  If the wound is  continuing to look worse then I will again emphasize to him the  importance of an amputation.     [name]. Fields, MD  Electronically Signed   CEF/MEDQ  D:  06/21/2010  T:  06/22/2010  Job:  [phone]

## 2010-11-21 ENCOUNTER — Other Ambulatory Visit: Payer: Self-pay | Admitting: Vascular Surgery

## 2010-11-21 DIAGNOSIS — I70269 Atherosclerosis of native arteries of extremities with gangrene, unspecified extremity: Secondary | ICD-10-CM

## 2010-11-21 LAB — DIFFERENTIAL
Basophils Absolute: 0 10*3/uL (ref 0.0–0.1)
Eosinophils Relative: 4 % (ref 0–5)
Lymphocytes Relative: 25 % (ref 12–46)
Lymphs Abs: 1.6 10*3/uL (ref 0.7–4.0)
Monocytes Absolute: 0.9 10*3/uL (ref 0.1–1.0)
Monocytes Relative: 13 % — ABNORMAL HIGH (ref 3–12)
Neutro Abs: 3.9 10*3/uL (ref 1.7–7.7)

## 2010-11-21 LAB — BASIC METABOLIC PANEL
BUN: 12 mg/dL (ref 6–23)
CO2: 27 mEq/L (ref 19–32)
Chloride: 105 mEq/L (ref 96–112)
Potassium: 4 mEq/L (ref 3.5–5.1)

## 2010-11-21 LAB — CBC
HCT: 30.8 % — ABNORMAL LOW (ref 39.0–52.0)
Hemoglobin: 9.4 g/dL — ABNORMAL LOW (ref 13.0–17.0)
MCH: 23.6 pg — ABNORMAL LOW (ref 26.0–34.0)
MCHC: 30.5 g/dL (ref 30.0–36.0)
MCV: 77.4 fL — ABNORMAL LOW (ref 78.0–100.0)
RDW: 17.1 % — ABNORMAL HIGH (ref 11.5–15.5)

## 2010-11-21 LAB — MAGNESIUM: Magnesium: 1.7 mg/dL (ref 1.5–2.5)

## 2010-11-21 LAB — GLUCOSE, CAPILLARY
Glucose-Capillary: 129 mg/dL — ABNORMAL HIGH (ref 70–99)
Glucose-Capillary: 122 mg/dL — ABNORMAL HIGH (ref 70–99)
Glucose-Capillary: 125 mg/dL — ABNORMAL HIGH (ref 70–99)

## 2010-11-21 LAB — PROTIME-INR: INR: 1.31 (ref 0.00–1.49)

## 2010-11-21 LAB — PHOSPHORUS: Phosphorus: 3.1 mg/dL (ref 2.3–4.6)

## 2010-11-22 ENCOUNTER — Ambulatory Visit: Payer: No Typology Code available for payment source | Admitting: Vascular Surgery

## 2010-11-22 LAB — CBC
HCT: 29.9 % — ABNORMAL LOW (ref 39.0–52.0)
Hemoglobin: 9 g/dL — ABNORMAL LOW (ref 13.0–17.0)
MCH: 23.5 pg — ABNORMAL LOW (ref 26.0–34.0)
MCV: 78.1 fL (ref 78.0–100.0)
Platelets: 326 10*3/uL (ref 150–400)
RBC: 3.83 MIL/uL — ABNORMAL LOW (ref 4.22–5.81)

## 2010-11-22 LAB — BASIC METABOLIC PANEL
BUN: 13 mg/dL (ref 6–23)
CO2: 29 mEq/L (ref 19–32)
Chloride: 103 mEq/L (ref 96–112)
Creatinine, Ser: 1.08 mg/dL (ref 0.4–1.5)
Glucose, Bld: 111 mg/dL — ABNORMAL HIGH (ref 70–99)
Potassium: 4.1 mEq/L (ref 3.5–5.1)

## 2010-11-22 LAB — GLUCOSE, CAPILLARY
Glucose-Capillary: 158 mg/dL — ABNORMAL HIGH (ref 70–99)
Glucose-Capillary: 199 mg/dL — ABNORMAL HIGH (ref 70–99)

## 2010-11-22 LAB — PROTIME-INR: INR: 1.3 (ref 0.00–1.49)

## 2010-11-23 LAB — BASIC METABOLIC PANEL
BUN: 13 mg/dL (ref 6–23)
Calcium: 10 mg/dL (ref 8.4–10.5)
Chloride: 104 mEq/L (ref 96–112)
Creatinine, Ser: 1.04 mg/dL (ref 0.4–1.5)
GFR calc Af Amer: >60 mL/min (ref >60)
GFR calc non Af Amer: >60 mL/min (ref >60)

## 2010-11-23 LAB — URINALYSIS, ROUTINE W REFLEX MICROSCOPIC
Bilirubin Urine: NEGATIVE
Hgb urine dipstick: NEGATIVE
Ketones, ur: NEGATIVE mg/dL
Nitrite: NEGATIVE
Protein, ur: NEGATIVE mg/dL
Specific Gravity, Urine: 1.009 (ref 1.005–1.030)
Urobilinogen, UA: 0.2 mg/dL (ref 0.0–1.0)

## 2010-11-23 LAB — GLUCOSE, CAPILLARY
Glucose-Capillary: 82 mg/dL (ref 70–99)
Glucose-Capillary: 116 mg/dL — ABNORMAL HIGH (ref 70–99)
Glucose-Capillary: 185 mg/dL — ABNORMAL HIGH (ref 70–99)
Glucose-Capillary: 264 mg/dL — ABNORMAL HIGH (ref 70–99)

## 2010-11-23 LAB — PROTIME-INR
INR: 1.38 (ref 0.00–1.49)
Prothrombin Time: 17.2 seconds — ABNORMAL HIGH (ref 11.6–15.2)

## 2010-11-23 LAB — CBC
MCH: 23.4 pg — ABNORMAL LOW (ref 26.0–34.0)
Platelets: 303 10*3/uL (ref 150–400)
RBC: 3.98 MIL/uL — ABNORMAL LOW (ref 4.22–5.81)
RDW: 17.1 % — ABNORMAL HIGH (ref 11.5–15.5)
WBC: 8 10*3/uL (ref 4.0–10.5)

## 2010-11-24 LAB — PROTIME-INR
INR: 1.45 (ref 0.00–1.49)
Prothrombin Time: 17.8 seconds — ABNORMAL HIGH (ref 11.6–15.2)

## 2010-11-24 LAB — GLUCOSE, CAPILLARY
Glucose-Capillary: 209 mg/dL — ABNORMAL HIGH (ref 70–99)
Glucose-Capillary: 239 mg/dL — ABNORMAL HIGH (ref 70–99)

## 2010-11-25 DIAGNOSIS — S88119A Complete traumatic amputation at level between knee and ankle, unspecified lower leg, initial encounter: Secondary | ICD-10-CM

## 2010-11-25 DIAGNOSIS — R509 Fever, unspecified: Secondary | ICD-10-CM

## 2010-11-25 LAB — GLUCOSE, CAPILLARY
Glucose-Capillary: 127 mg/dL — ABNORMAL HIGH (ref 70–99)
Glucose-Capillary: 241 mg/dL — ABNORMAL HIGH (ref 70–99)
Glucose-Capillary: 231 mg/dL — ABNORMAL HIGH (ref 70–99)

## 2010-11-25 LAB — CBC
HCT: 27.9 % — ABNORMAL LOW (ref 39.0–52.0)
Hemoglobin: 8.4 g/dL — ABNORMAL LOW (ref 13.0–17.0)
MCH: 23.5 pg — ABNORMAL LOW (ref 26.0–34.0)
MCHC: 30.1 g/dL (ref 30.0–36.0)
RBC: 3.58 MIL/uL — ABNORMAL LOW (ref 4.22–5.81)

## 2010-11-25 LAB — BASIC METABOLIC PANEL
CO2: 29 mEq/L (ref 19–32)
Calcium: 9.7 mg/dL (ref 8.4–10.5)
GFR calc Af Amer: >60 mL/min (ref >60)
GFR calc non Af Amer: 59 mL/min — ABNORMAL LOW (ref >60)
Potassium: 4.1 mEq/L (ref 3.5–5.1)
Sodium: 138 mEq/L (ref 135–145)

## 2010-11-25 LAB — URINE CULTURE

## 2010-11-25 LAB — PROTIME-INR: INR: 1.44 (ref 0.00–1.49)

## 2010-11-26 LAB — GLUCOSE, CAPILLARY: Glucose-Capillary: 150 mg/dL — ABNORMAL HIGH (ref 70–99)

## 2010-11-26 LAB — PROTIME-INR: INR: 1.5 — ABNORMAL HIGH (ref 0.00–1.49)

## 2010-11-27 NOTE — Op Note (Signed)
  NAME:  Jesus Estrada, Jesus Estrada NO.:  [id number]  MEDICAL RECORD NO.:  [id number]           PATIENT TYPE:  I  LOCATION:  2035                         FACILITY:  [facility]  PHYSICIAN:  [name]. Caylen Kuwahara, MD  DATE OF BIRTH:  [birth year]  DATE OF PROCEDURE:  11/21/2010 DATE OF DISCHARGE:                              OPERATIVE REPORT   PROCEDURE:  Left below-knee amputation.  PREOPERATIVE DIAGNOSIS:  Nonhealing wound left foot.  POSTOPERATIVE DIAGNOSIS:  Nonhealing wound left foot.  ANESTHESIA:  General.  ASSISTANT:  Pecola Leisure, PA-C  OPERATIVE FINDINGS:  Well-perfused tissues, left below-knee amputation site.  OPERATIVE DETAILS:  After obtaining informed consent, the patient was taken to the operating room.  The patient was placed in supine position on the operating table.  After induction of general anesthesia and endotracheal intubation, the patient's entire left lower extremity was prepped and draped in usual sterile fashion.  Next, a transverse incision was made approximately five fingerbreadths below the tibial tuberosity on the left leg.  Incision was carried down to the midportion of the leg and then extended longitudinally to create a posterior flap. Subcutaneous tissues, muscle, and fascia were taken down with cautery. The tibia was isolated and dissected free circumferentially and the periosteum was raised approximately 5 cm above the skin edge.  Fibula was also dissected free circumferentially and the periosteum raised on a slightly higher than the tibia.  Fibula was divided with a bone cutter. The tibia was divided with a saw.  The leg was elevated up in the operative field and an amputation knife was used to complete the amputation posterior to the bones, create a posterior flap.  The foot was passed off the table as a specimen.  There was several arterial branches which were heavily calcified with pulsatile bleeding.  These were all ligated with 2-0  silk ties or 2-0 silk suture ligatures.  There was some venous bleeding which was controlled with cautery or 2-0 silk figure-of-eight sutures.  After hemostasis had been obtained, the leg was thoroughly irrigated with normal saline solution.  The fascial edges were reapproximated anterior to posterior using 2-0 interrupted Vicryl sutures.  The subcutaneous tissues were closed with a running 3-0 Vicryl subcutaneous stitch. Skin was closed with staples.  The patient tolerated the procedure well and there were no complications.  Instrument, sponge, and needle counts were correct at the end of the case.  The patient was taken to the recovery room in stable condition.     [name]. Rheda Kassab, MD     CEF/MEDQ  D:  11/21/2010  T:  11/21/2010  Job:  [id number]  Electronically Signed by Fabienne Bruns MD on 11/27/2010 08:27:50 AM

## 2010-11-27 NOTE — Group Therapy Note (Addendum)
NAME:  Jesus Estrada, Jesus Estrada NO.:  [id number]  MEDICAL RECORD NO.:  [id number]           PATIENT TYPE:  I  LOCATION:  1507                         FACILITY:  [facility]  PHYSICIAN:  Erick Blinks, MD     DATE OF BIRTH:  12/28/1940                                PROGRESS NOTE   PRIMARY CARE PHYSICIAN: Primary care physician is at the [place].  VASCULAR SURGEON: [name]. Fields, MD.  CURRENT DIAGNOSES LIST: 1. Chronic left foot ulcer with osteomyelitis, currently on     intravenous antibiotics. 2. Atrial fibrillation, rate controlled. 3. Klebsiella urinary tract infection, treated. 4. Iron-deficiency anemia, fecal occult blood test negative x2. 5. Hypertension. 6. Insulin-dependent diabetes. 7. History of renal transplant, on immunosuppression. 8. Deconditioning.  ADMISSION HISTORY: This is a 70 year old gentleman with history of renal transplant, hypertension, diabetes, and a right BKA who presented to the emergency room with low-grade fevers and confusion.  It was felt that the patient's foot was actively infected.  The patient was found to have low- grade fevers.  He was also somewhat confused.  CT scan of the head did not show any acute findings.  Left foot x-ray showed extensive postsurgical changes and probable acute osteomyelitis.  He was also found to be in rapid ventricular response with a new atrial fibrillation.  He was subsequently admitted for further evaluation.  For further details, please refer to the history and physical dictated by Dr. Delsa Grana on Nov 12, 2010.  HOSPITAL COURSE.: 1. Osteomyelitis of the left foot.  The patient was started on empiric     antibiotics with vancomycin and Zosyn.  He was seen by wound care     as well as his vascular surgeon, Dr. Darrick Penna.  He was started on     hydrotherapy.  Despite getting IV antibiotics as well as pulse     lavage therapy, his wounds have continued to get worse.  He is     currently afebrile and does not  appear septic.  His systemic     infection has definitely improved but his wounds have continued to     get worse.  Per Physical Therapy notes, the wound on top of the     foot is increasing in size and also depth.  Case was further     discussed with Dr. Darrick Penna and it was felt that since the wound is     getting worse that the patient will require transfer to [facility] for below-knee amputation.  This was also discussed with     the patient, although he is understandably upset about the     deficient, he does understand that this would be necessary. 2. Atrial fibrillation with rapid ventricular response.  The patient     was found to be in rapid ventricular response on admission.  He was     started on rate control medicine which brought his heart rate to     normal range.  Review his old records through his primary care     physician did not reveal history of atrial  fibrillation.     Therefore, it is new Afib.  He had a 2-D echocardiogram done which     showed preserved ejection fraction and mildly dilated left atrium.     He is currently rate controlled and remaining in atrial     fibrillation.  His CHADS score is 2 and therefore he was started on     anticoagulation.  His INR is currently 2.38.  We will have to give     him one dose of vitamin K in anticipation for surgery.  After his     surgery, he can likely be restarted on anticoagulation. 3. Sepsis.  This has since resolved with fluids and hydration. 4. History of renal transplant with acute renal failure.  The patient     did have acute renal failure with a creatinine of 1.6 on admission.     He was seen in consultation by Dr. Lowell Guitar from Nephrology Service.     He was continued on his immunosuppressions as well as IV fluids.     His creatinine has since returned back to normal range. 5. Iron-deficiency anemia.  The patient found to have a significant     microcytic anemia.  His iron level was found be some low and  was     started on iron supplementation.  His stool occult bloods have been     negative x2 and he has not had any visible bleeding.  This will     need to be followed further. 6. Hypertension.  We have added lisinopril to his regimen, this can be     further titrated. 7. Klebsiella UTI.  This has been adequately treated with the     patient's current antibiotics. 8. Venous access issues.  The patient was unable to have a peripheral     IV due to venous access issues.  An IJ central line has been placed     by Interventional Radiology and this will need to be removed prior     to discharge. 9. Deconditioning.  The patient was seen by Physical Therapy and will     require transfer to a skilled nursing facility once ready for     discharge for continue rehab.  CONSULTATIONS: 1. Dr. Lowell Guitar, Nephrology. 2. Dr. Darrick Penna, Vascular Surgery.  PROCEDURES.: Placement of right internal jugular catheter for venous access by interventional radiology.  DIAGNOSTICS IMAGING STUDIES: 1. Chest x-ray on admission shows no active disease. 2. Chest x-ray of the foot shows extensive postsurgical changes as     above, probable acute osteomyelitis involving remnants of the third     and fourth metatarsals. 3. CT head on Nov 12, 2010, shows age-related cerebral atrophy,     ventriculomegaly and periventricular white matter disease.  No     acute intracranial findings or mass lesion.  That brings [place] up-to-date on the hospital course.  The patient will be transferred to [facility] for further care.  This was also discussed with his family.     Erick Blinks, MD     JM/MEDQ  D:  11/19/2010  T:  11/19/2010  Job:  [id number]  Electronically Signed by Durward Mallard MEMON  on 11/27/2010 05:01:50 PM

## 2010-11-29 LAB — CULTURE, BLOOD (ROUTINE X 2)
Culture  Setup Time: 201205181609
Culture  Setup Time: 201205181609

## 2010-11-30 LAB — CULTURE, BLOOD (SINGLE): Culture  Setup Time: 201205190125

## 2010-12-05 NOTE — Discharge Summary (Addendum)
NAME:  Jesus Estrada, Jesus Estrada NO.:  [id number]  MEDICAL RECORD NO.:  [id number]           PATIENT TYPE:  LOCATION:                                 FACILITY:  PHYSICIAN:  Calvert Cantor, M.D.     DATE OF BIRTH:  29-Jul-1940  DATE OF ADMISSION:  11/12/2010 DATE OF DISCHARGE:  11/26/2010                              DISCHARGE SUMMARY   PRIMARY CARE PHYSICIAN:  At the [place] in [place].  VASCULAR SURGEON:  [name]. Fields, MD  The patient also has a nephrologist at the [place].  CHIEF COMPLAINT:  Low-grade fevers, left foot infection.  DIAGNOSES ON DISCHARGE: 1. Chronic left foot ulcer with osteomyelitis nonhealing, requiring a     left below-the-knee amputation. 2. Atrial fibrillation rate controlled. 3. Klebsiella urinary tract infection which has been treated. 4. Iron deficiency anemia.  Fecal occult blood test negative x2. 5. Hypertension. 6. Insulin-dependent diabetes mellitus. 7. Renal transplant on immunosuppression. 8. History of right below-knee amputation.  DISCHARGE MEDICATIONS:  Ferrous sulfate 325 mg by mouth twice a day, this is a new medication.  All others are his chronic medications. These are as follows. 1. Aspirin enteric coated 81 mg daily. 2. CellCept 250 mg 3 capsules twice a day. 3. Gabapentin 300 mg 2 tablets in the morning, 3 in the evening. 4. Insulin aspart 3 times a day with food at bedtime that is 5 units. 5. Lantus 30 units daily. 6. Metoprolol 50 mg twice a day. 7. Mirtazapine 15 mg daily at bedtime. 8. Prednisone 5 mg daily. 9. Procardia XL 60 mg twice a day. 10.Prograf 0.5 mg 7 capsules twice a day. 11.Simvastatin 40 mg 1/2 tablet daily at bedtime. 12.Warfarin 7.5 mg daily to be adjusted based on INR.  PROCEDURES: 1. Nov 21, 2010 left below-the-knee amputation performed by Dr.     Fabienne Bruns. 2. Chest x-ray 2-view on Nov 12, 2010 revealed a heart that was upper     normal in size and normal lungs.  X-ray of his left foot  Nov 12, 2010 revealed extensive postsurgical changes with only remnants of metatarsals remaining.  Third through fifth digits have been surgically removed since prior study.  There are probable changes of osteomyelitis involving the remnants of third and fourth metatarsals.  No gas in soft tissue.  There are heavy arterial calcifications.  CT of the head without contrast on Nov 12, 2010 revealed age-related cerebral atrophy, ventriculomegaly, and periventricular white matter disease.  The patient had a placement of right jugular central venous catheter with ultrasound and fluoroscopic guidance, Nov 16, 2010.  PERTINENT BLOOD WORK:  Hemoglobin is 8.4, hematocrit 27.9, MCV is low at 77.9.  CONSULTS DURING HOSPITAL STAY: 1. Vascular Surgery consult with Dr. Darrick Penna. 2. Nephrology consult.  The patient was seen by Dr. Casimiro Needle.  HOSPITAL COURSE: 1. Left foot osteomyelitis.  This is a chronic issue which the patient     presented with.  Unfortunately, despite IV antibiotics, healing was     quite poor after being monitored for improvement.  It was     eventually decided that the foot was  not going to heal and Dr.     Darrick Penna performed a left BKA on Nov 21, 2010.  He has subsequently     been progressing well.  The wound is clean.  Staples are intact and     he has been doing well with physical therapy. 2. Atrial fibrillation.  The patient does not appear to have a history     of this.  Rate was controlled with medications, 2-D echo was     performed.  A 2-D echo results:  LV function is 50-55%, diastolic     dysfunction cannot be assessed.  There was mild concentric     hypertrophy, calcified annulus of the mitral valve.  Left atrium     was mildly dilated.  The patient has been started on Coumadin and     INR does need to be followed as an outpatient.  Heart rate is well     controlled currently on medication. 3. Urinary tract infection.  Urine culture grew out greater than      100,000 colonies of Klebsiella pneumonia which was resistant to     ampicillin, intermediate sensitive to nitrofurantoin and sensitive     to all other antibiotics.  His urinary tract infection has been     treated with levofloxacin and has resolved. 4. Of note, the patient developed a temperature of 101 degrees about 2     days postop.  Due to the fact that the patient is immunosuppressed     and the fact that he had a central line at that time, he was     started on antibiotics.  We initiated vancomycin and Zosyn.     Culture from the central line was obtained, 2 peripheral cultures     were obtained, and a UA was repeated.  A chest x-ray was not done     as he did not have any symptoms of pneumonia.  He in fact did not     have any symptoms of infection other than the noted temperature.     The blood cultures have remained negative thus far including the     culture that was obtained from the central line.  UA was noted to     be negative and urine culture was negative as well.  I did request     a consult with Infectious Disease.  At this point it is suspected     that this was simply a postop fever as no other source of infectionhas been found.  The patient's antibiotics are now being     discontinued.  He is to be monitored at the facility over the next     few days for fevers.  Again his stump looks clean and no other     source of infection has been noted.  The patient is being discharged to a rehab facility.  DISCHARGE PHYSICAL EXAMINATION:  GENERAL:  Today he is awake, alert, oriented x3. HEART:  Regular rate and rhythm.  No murmurs. LUNGS:  Clear bilaterally.  He has a good respiratory effort. ABDOMEN:  Soft, nontender, nondistended.  Bowel sounds positive.  No organomegaly. EXTREMITIES:  He now has bilateral BKAs on the left.  Wound is clean. Staples are intact.  There is no surrounding cellulitis or tenderness.  FOLLOWUP INSTRUCTIONS: 1. INR needs to be followed and dose  of Coumadin needs to be adjusted.     I would recommend a repeat INR 2 days from today.  INR  on discharge     today is 1.5.  He is currently in normal sinus rhythm. 2. Temperature needs to be checked twice a day for the next 3 days. 3. The patient's staples need to be removed in 1 month. 4. He can return to see Dr. Fabienne Bruns, vascular surgeon in 4     weeks.  Phone number is noted on discharge paper, [phone]. 5. He needs to follow up with Dr. Dr. Prentiss Bells, [place], [place].  Phone number is [phone]  DISCHARGE DIET:  Low-sodium, low-fat diabetic diet.  Time on discharge was 50 minutes.     Calvert Cantor, M.D.     SR/MEDQ  D:  11/26/2010  T:  11/26/2010  Job:  [id number]  cc:   Steva Colder. Darrick Penna, MD  Electronically Signed by Calvert Cantor M.D. on 12/05/2010 09:19:14 AM Electronically Signed by Calvert Cantor M.D. on 12/05/2010 09:19:11 AM

## 2010-12-13 NOTE — H&P (Addendum)
NAME:  Jesus Estrada, Jesus Estrada NO.:  [id number]  MEDICAL RECORD NO.:  [id number]           PATIENT TYPE:  I  LOCATION:  1233                         FACILITY:  [facility]  PHYSICIAN:  Rock Nephew, MD       DATE OF BIRTH:  [birth year]  DATE OF ADMISSION:  11/12/2010 DATE OF DISCHARGE:                             HISTORY & PHYSICAL   PRIMARY CARE PHYSICIAN:  At [facility].  PRIMARY VASCULAR SURGEON:  [name]. Fields, MD  NEPHROLOGIST:  The patient reports that his kidney doctor, nephrologist, Dr. Lenis Noon at [facility]  CHIEF COMPLAINT:  Low-grade fevers, left foot infection, UTI.  HISTORY OF PRESENT ILLNESS:  This is a 70 year old male with a history of multiple medical problems including a history of hypertension, diabetes, history of renal failure, status post kidney transplant, history of right BKA and multiple amputations of the left foot.  The patient is alert, awake and oriented to self.  He is not sure exactly what the date was.  He knows that he is in the hospital.  The patient is confused.  I do not really know what the patient's baseline mental status is.  There are no family members available.  The patient is brought into the [facility] for left foot infection, some mild confusion I believe and urinary tract infection.  Currently, the patient denies any chest pain, any shortness of breath, any nausea, vomiting. He denies any pain in his foot and any burning on urination.  The patient reports that he was having a little bit of low-grade fevers. While in the [facility], the patient had a CT scan of the head which showed no acute findings.  The patient had a left foot x-ray which shows extensive postsurgical changes, probable acute osteomyelitis involving the remnants of third and fourth metatarsals.  The patient also had a chest x-ray which showed no active disease.  Also, the patient's EKG also was significant for Afib with RVR.  While  there, the patient did not have leukocytosis or his creatinine was increased to 1.60 from a baseline level of 1.1.  The patient also had many bacteria per high-powered field.  The EDP told me that the patient was given vancomycin, Zosyn and ciprofloxacin at the [facility].  We are asked to admit this patient.  PAST MEDICAL HISTORY: 1. History of hypertension. 2. History of diabetes. 3. History of renal failure with kidney transplant. 4. History of right BKA. 5. History of amputation of the left foot toes, multiple toes. 6. Also suspect some peripheral vascular disease.  PAST SURGICAL HISTORY: 1. Right BKA. 2. Amputation of the toes on the left foot. 3. History of renal transplant.  SOCIAL HISTORY:  The patient is a smoker.  He does not drink.  He reports that he lives by himself, however, he is not a great historian. He does report that he has a wheelchair.  ALLERGIES:  No known drug allergies.  FAMILY HISTORY:  His sister has diabetes and 2 children has diabetes.  HOME MEDICATIONS:  Currently, the patient is not sure what his home medications  are.  The patient was discharged from this hospital on November 2011.  He was taking prednisone, mirtazapine, Procardia, CellCept, Prograf, baby aspirin a day.  He was also taking Lantus, insulin, NovoLog and metoprolol.  REVIEW OF SYSTEMS:  He denies any headache.  He denies any blurry vision.  He denies any chest pain or any shortness of breath.  He denies any nausea, vomiting, abdominal pain.  He denies any constipation or any diarrhea.  He denies any pain in his legs.  He report some low-grade fevers.  PHYSICAL EXAMINATION:  VITAL SIGNS:  Were as follows:  The patient's temperature is 98.8, blood pressure has ranged from 149 to 115 systolic, 86 to 75 diastolic.  Pulse has ranged from 127 to 119, respiratory rate is 16, saturating 98% on 2 L nasal cannula. HEAD, EYES, EARS, NOSE THROAT:  Normocephalic, atraumatic.   Pupils are equally round and reactive to light. CARDIOVASCULAR:  S1 and S2.  Regular rate and rhythm.  No murmurs, no rubs. LUNGS:  Clear to auscultation bilaterally.  No wheezes, no rhonchi. ABDOMEN:  Soft, nontender, nondistended.  Bowel sounds are positive.  No guarding.  No rebound tenderness. EXTREMITIES:  There is no lower extremity edema.  The patient has offensive odor from the left foot, however, I do not see any drainage of any clear or any pus and there is some mild erythema along the lateral aspect of the foot as the patient has evidence of multiple toe amputations.  The patient also has a right BKA.  LABORATORY DATA:  The patient's 12-lead EKG shows Afib with RVR, left anterior fascicular block.  The patient's old EKG showed a sinus rhythm, first-degree AV block.  No evidence of Afib.  The patient's radiological studies, the patient had a 2-view chest x-ray shows no active disease, heart size upper size is normal.  The patient's 2-view x-ray of left foot shows extensive postsurgical changes as above, probable acute osteomyelitis involved the remnants of third and fourth metatarsals.  CT of the head on Nov 12, 2010 showed age-related cerebral atrophy, ventriculomegaly and periventricular white matter disease.  No acute intracranial findings or mass lesions.  Laboratory studies for the patient are as follows:  WBC count 8.7, hemoglobin is 13.2, hematocrit is 40.3, MCV is 72.6, platelets are 315, neutrophils are 75.  Sodium is 127, potassium is 4.2, chloride is 94, bicarbonate is 20, BUN is 31, creatinine is 1.60.  The patient's last BUN and creatinine were 19 or 1.1.  Total bili 0.4, alk phos 97, AST 10, ALT 5, total protein 9.9, albumin 2.7, calcium elevated at 11.2, procalcitonin 0.32, lactic acid 2.0, troponin I is less than 0.030.  IMPRESSION/PLAN:  This is a 70 year old male with a history of multiple medical problems is admitted for multiple issues, mainly left  foot infection, urinary tract infection and also possible early sepsis and also atrial fibrillation with rapid ventricular rate. 1. Left foot osteomyelitis, currently the patient has blood cultures     done.  The patient has some wound cultures done.  The patient will     be placed on vancomycin and Zosyn at this time.  The patient will     need a vascular surgery consult in the morning. 2. Urinary tract infection.  The patient will be on Zosyn, hopefully     that will cover the patient's urine bacteria. 3. Atrial fibrillation with rapid ventricular rate and possible     supraventricular tachycardia.  The patient will be started on  diltiazem drip with a heart rate control goal of 70 to 110.  We     will also get a 2-D echocardiogram to look for any structural     abnormalities of the heart. 4. History of renal transplant and acute renal failure.  The patient     has a history of renal transplant.  The patient is most likely     taking Prograf and CellCept; however, the patient does not know his     medications.  We will await pharmacy to do the Medrec.  For the     time being, the patient will be fluid hydrated, normal saline at     100 mL an hour, hopefully the patient's creatinine will improve     with fluids.  We will also check a Prograf level.  The patient will     need a nephrology consultation. 5. History of hypertension.  I will continue the patient on metoprolol     for the time being. 6. Type 2 diabetes mellitus.  I will place the patient on Lantus 20     units at bedtime plus q.4 CGB moderate intensity sliding scale. 7. Deep venous thrombosis prophylaxis.  The  patient will be placed on     Lovenox.  In addition, the patient will also get an EKG in the a.m.     The patient will also get a wound care consult.  The patient will     also get blood cultures x2. Code status was discussed with the patient.  The patient wishes to be a full code.     Rock Nephew,  MD     NH/MEDQ  D:  11/12/2010  T:  11/12/2010  Job:  [id number]  Electronically Signed by Rock Nephew MD on 12/13/2010 12:27:33 PM

## 2010-12-27 ENCOUNTER — Ambulatory Visit: Payer: No Typology Code available for payment source | Admitting: Vascular Surgery

## 2010-12-28 NOTE — Assessment & Plan Note (Addendum)
OFFICE VISIT  HAVOC, SANLUIS DOB:  1941-03-29                                       12/27/2010 ZOXWR#:[id number]  The patient was a no show for his office visit today.    [name]. Janaiyah Blackard, MD Electronically Signed  CEF/MEDQ  D:  12/27/2010  T:  12/28/2010  Job:  [id number]

## 2012-11-20 IMAGING — CR DG CHEST 2V
1 series · 1 of 1 positions shown · non-contrast
Comparison: None.

CLINICAL DATA: Hypertension/elevated blood sugar/foot infection

CHEST - 2 VIEW

[view not recorded]
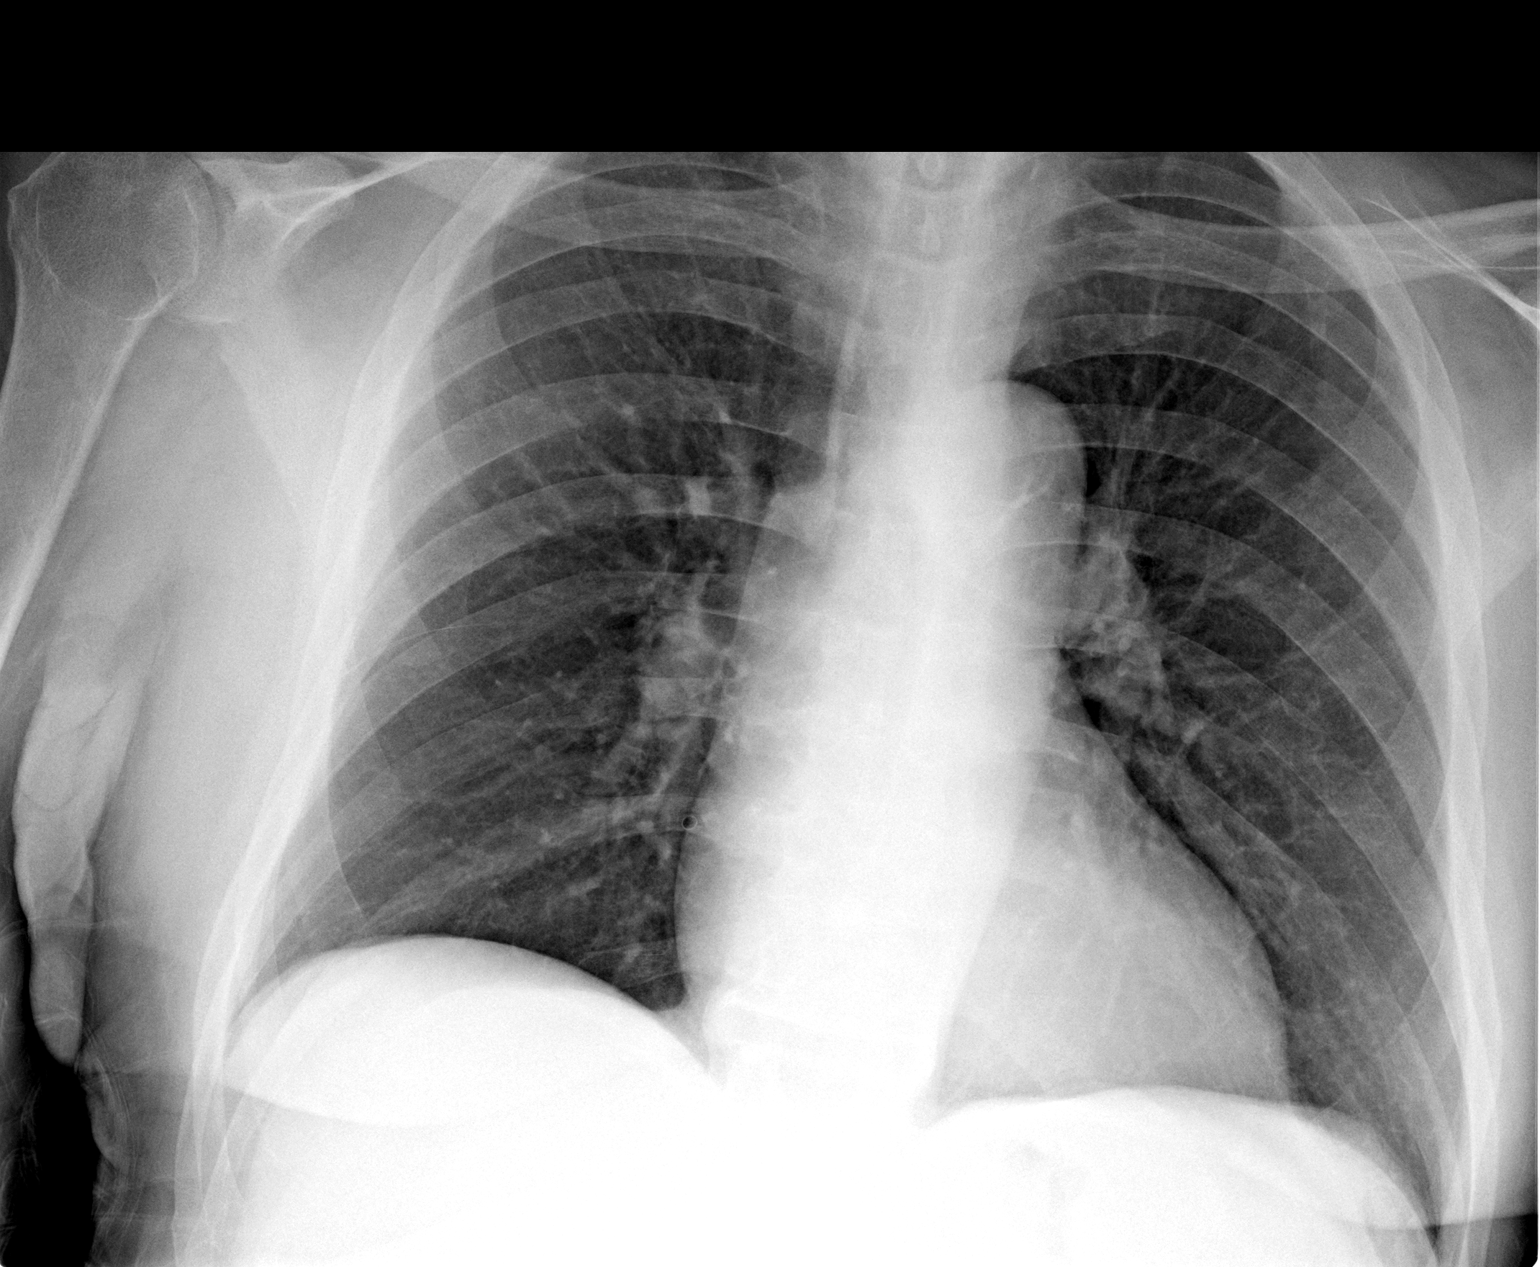

[1 of 1 positions shown; findings below may reference images not displayed]

FINDINGS: Heart size upper normal.  Lungs clear.  No pleural fluid
or osseous lesions.
IMPRESSION: No active disease.

## 2014-02-05 ENCOUNTER — Encounter (HOSPITAL_BASED_OUTPATIENT_CLINIC_OR_DEPARTMENT_OTHER): Payer: Self-pay | Admitting: Emergency Medicine

## 2014-02-05 ENCOUNTER — Emergency Department (HOSPITAL_BASED_OUTPATIENT_CLINIC_OR_DEPARTMENT_OTHER)
Admission: EM | Admit: 2014-02-05 | Discharge: 2014-02-05 | Disposition: A | Discharge status: Home / Self Care | Payer: Medicare Other | Attending: Emergency Medicine | Admitting: Emergency Medicine

## 2014-02-05 DIAGNOSIS — I10 Essential (primary) hypertension: Secondary | ICD-10-CM | POA: Insufficient documentation

## 2014-02-05 DIAGNOSIS — Z792 Long term (current) use of antibiotics: Secondary | ICD-10-CM | POA: Diagnosis not present

## 2014-02-05 DIAGNOSIS — Z79899 Other long term (current) drug therapy: Secondary | ICD-10-CM | POA: Insufficient documentation

## 2014-02-05 DIAGNOSIS — Z940 Kidney transplant status: Secondary | ICD-10-CM | POA: Diagnosis not present

## 2014-02-05 DIAGNOSIS — F17200 Nicotine dependence, unspecified, uncomplicated: Secondary | ICD-10-CM | POA: Insufficient documentation

## 2014-02-05 DIAGNOSIS — IMO0002 Reserved for concepts with insufficient information to code with codable children: Secondary | ICD-10-CM | POA: Insufficient documentation

## 2014-02-05 DIAGNOSIS — R4182 Altered mental status, unspecified: Secondary | ICD-10-CM

## 2014-02-05 DIAGNOSIS — S88119A Complete traumatic amputation at level between knee and ankle, unspecified lower leg, initial encounter: Secondary | ICD-10-CM | POA: Diagnosis not present

## 2014-02-05 DIAGNOSIS — N390 Urinary tract infection, site not specified: Secondary | ICD-10-CM | POA: Diagnosis present

## 2014-02-05 DIAGNOSIS — N289 Disorder of kidney and ureter, unspecified: Secondary | ICD-10-CM | POA: Diagnosis not present

## 2014-02-05 DIAGNOSIS — R739 Hyperglycemia, unspecified: Secondary | ICD-10-CM

## 2014-02-05 HISTORY — DX: Disorder of kidney and ureter, unspecified: N28.9

## 2014-02-05 HISTORY — DX: Type 2 diabetes mellitus without complications: E11.9

## 2014-02-05 HISTORY — DX: Essential (primary) hypertension: I10

## 2014-02-05 LAB — COMPREHENSIVE METABOLIC PANEL
ALT: 7 U/L (ref 0–53)
AST: 12 U/L (ref 0–37)
Albumin: 2.7 g/dL — ABNORMAL LOW (ref 3.5–5.2)
Alkaline Phosphatase: 72 U/L (ref 39–117)
Anion gap: 14 (ref 5–15)
BUN: 40 mg/dL — ABNORMAL HIGH (ref 6–23)
CALCIUM: 10.3 mg/dL (ref 8.4–10.5)
CO2: 18 mEq/L — ABNORMAL LOW (ref 19–32)
CREATININE: 1.4 mg/dL — AB (ref 0.50–1.35)
Chloride: 106 mEq/L (ref 96–112)
GFR calc Af Amer: 57 mL/min — ABNORMAL LOW (ref >90)
GFR calc non Af Amer: 49 mL/min — ABNORMAL LOW (ref >90)
Glucose, Bld: 258 mg/dL — ABNORMAL HIGH (ref 70–99)
Potassium: 5.2 mEq/L (ref 3.7–5.3)
SODIUM: 138 meq/L (ref 137–147)
TOTAL PROTEIN: 8.2 g/dL (ref 6.0–8.3)
Total Bilirubin: 0.4 mg/dL (ref 0.3–1.2)

## 2014-02-05 LAB — URINALYSIS, ROUTINE W REFLEX MICROSCOPIC
BILIRUBIN URINE: NEGATIVE
Glucose, UA: NEGATIVE mg/dL
KETONES UR: NEGATIVE mg/dL
Nitrite: NEGATIVE
PH: 5 (ref 5.0–8.0)
Protein, ur: 100 mg/dL — AB
SPECIFIC GRAVITY, URINE: 1.015 (ref 1.005–1.030)
Urobilinogen, UA: 1 mg/dL (ref 0.0–1.0)

## 2014-02-05 LAB — CBC WITH DIFFERENTIAL/PLATELET
BASOS ABS: 0 10*3/uL (ref 0.0–0.1)
BASOS PCT: 0 % (ref 0–1)
EOS ABS: 0.1 10*3/uL (ref 0.0–0.7)
EOS PCT: 1 % (ref 0–5)
HEMATOCRIT: 40.6 % (ref 39.0–52.0)
HEMOGLOBIN: 13.1 g/dL (ref 13.0–17.0)
Lymphocytes Relative: 19 % (ref 12–46)
Lymphs Abs: 1.6 10*3/uL (ref 0.7–4.0)
MCH: 24.6 pg — ABNORMAL LOW (ref 26.0–34.0)
MCHC: 32.3 g/dL (ref 30.0–36.0)
MCV: 76.2 fL — ABNORMAL LOW (ref 78.0–100.0)
MONO ABS: 1.1 10*3/uL — AB (ref 0.1–1.0)
Monocytes Relative: 13 % — ABNORMAL HIGH (ref 3–12)
NEUTROS PCT: 67 % (ref 43–77)
Neutro Abs: 5.6 10*3/uL (ref 1.7–7.7)
Platelets: 236 10*3/uL (ref 150–400)
RBC: 5.33 MIL/uL (ref 4.22–5.81)
RDW: 16.3 % — ABNORMAL HIGH (ref 11.5–15.5)
WBC: 8.4 10*3/uL (ref 4.0–10.5)

## 2014-02-05 LAB — CBG MONITORING, ED
GLUCOSE-CAPILLARY: 107 mg/dL — AB (ref 70–99)
Glucose-Capillary: 278 mg/dL — ABNORMAL HIGH (ref 70–99)

## 2014-02-05 LAB — I-STAT CG4 LACTIC ACID, ED: LACTIC ACID, VENOUS: 1.15 mmol/L (ref 0.5–2.2)

## 2014-02-05 LAB — URINE MICROSCOPIC-ADD ON

## 2014-02-05 MED ORDER — CEPHALEXIN 500 MG PO CAPS: ORAL_CAPSULE | ORAL | Status: AC

## 2014-02-05 MED ORDER — CEFTRIAXONE SODIUM 2 G IJ SOLR
INTRAMUSCULAR | Status: AC
  Filled 2014-02-05: qty 2

## 2014-02-05 MED ORDER — INSULIN ASPART 100 UNIT/ML ~~LOC~~ SOLN
5.0000 [IU] | Freq: Once | SUBCUTANEOUS | Status: AC
  Administered 2014-02-05: 5 [IU] via SUBCUTANEOUS
  Filled 2014-02-05: qty 1

## 2014-02-05 MED ORDER — SODIUM CHLORIDE 0.9 % IV BOLUS (SEPSIS)
30.0000 mL/kg | Freq: Once | INTRAVENOUS | Status: AC
  Administered 2014-02-05: 1000 mL via INTRAVENOUS

## 2014-02-05 MED ORDER — DEXTROSE 5 % IV SOLN: 2.0000 g | INTRAVENOUS | Status: DC

## 2014-02-05 MED ORDER — SODIUM CHLORIDE 0.9 % IV SOLN: 1000.0000 mL | INTRAVENOUS | Status: DC

## 2014-02-05 MED ORDER — DEXTROSE 5 % IV SOLN
2.0000 g | Freq: Once | INTRAVENOUS | Status: AC
  Administered 2014-02-05: 2 g via INTRAVENOUS

## 2014-02-05 NOTE — ED Notes (Signed)
PT discharged to home with family. NAD. 

## 2014-02-05 NOTE — Progress Notes (Signed)
ANTIBIOTIC CONSULT NOTE - INITIAL  Pharmacy Consult for ceftriaxone Indication: urosepsis  No Known Allergies  Patient Measurements: Height: 5\' 11"  (180.3 cm) Weight: 250 lb (113.399 kg) IBW/kg (Calculated) : 75.3 Adjusted Body Weight:   Vital Signs: Temp: 99.3 F (37.4 C) (08/01 1337) Temp src: Oral (08/01 1337) BP: 141/75 mmHg (08/01 1337) Pulse Rate: 81 (08/01 1337) Intake/Output from previous day:   Intake/Output from this shift:    Labs: No results found for this basename: WBC, HGB, PLT, LABCREA, CREATININE,  in the last 72 hours Estimated Creatinine Clearance: 70.5 ml/min (by C-G formula based on Cr of 1.23). No results found for this basename: VANCOTROUGH, VANCOPEAK, VANCORANDOM, GENTTROUGH, GENTPEAK, GENTRANDOM, TOBRATROUGH, TOBRAPEAK, TOBRARND, AMIKACINPEAK, AMIKACINTROU, AMIKACIN,  in the last 72 hours   Microbiology: No results found for this or any previous visit (from the past 720 hour(s)).  Medical History: Past Medical History  Diagnosis Date  . Diabetes mellitus without complication   . Renal disorder   . Hypertension    Assessment: 73 year [facility]rose[facility]. Patient has wt of 113kg, and orders received to start ceftriaxone, will dose 2g q24h. First dose already given.  Goal of Therapy:  Resolution of infection  Plan:  Measure antibiotic drug levels at steady state Follow up culture results Ceftriaxone 2g q24h  Sheppard CoilFrank Deirdra Heumann PharmD., BCPS Clinical Pharmacist Pager [phone] 02/05/2014 4:11 PM

## 2014-02-05 NOTE — Discharge Instructions (Signed)
Altered Mental Status Altered mental status most often refers to an abnormal change in your responsiveness and awareness. It can affect your speech, thought, mobility, memory, attention span, or alertness. It can range from slight confusion to complete unresponsiveness (coma). Altered mental status can be a sign of a serious underlying medical condition. Rapid evaluation and medical treatment is necessary for patients having an altered mental status. CAUSES   Low blood sugar (hypoglycemia) or diabetes.  Severe loss of body fluids (dehydration) or a body salt (electrolyte) imbalance.  A stroke or other neurologic problem, such as dementia or delirium.  A head injury or tumor.  A drug or alcohol overdose.  Exposure to toxins or poisons.  Depression, anxiety, and stress.  A low oxygen level (hypoxia).  An infection.  Blood loss.  Twitching or shaking (seizure).  Heart problems, such as heart attack or heart rhythm problems (arrhythmias).  A body temperature that is too low or too high (hypothermia or hyperthermia). DIAGNOSIS  A diagnosis is based on your history, symptoms, physical and neurologic examinations, and diagnostic tests. Diagnostic tests may include:  Measurement of your blood pressure, pulse, breathing, and oxygen levels (vital signs).  Blood tests.  Urine tests.  X-ray exams.  A computerized magnetic scan (magnetic resonance imaging, MRI).  A computerized X-ray scan (computed tomography, CT scan). TREATMENT  Treatment will depend on the cause. Treatment may include:  Management of an underlying medical or mental health condition.  Critical care or support in the hospital. HOME CARE INSTRUCTIONS   Only take over-the-counter or prescription medicines for pain, discomfort, or fever as directed by your caregiver.  Manage underlying conditions as directed by your caregiver.  Eat a healthy, well-balanced diet to maintain strength.  Join a support group or  prevention program to cope with the condition or trauma that caused the altered mental status. Ask your caregiver to help choose a program that works for you.  Follow up with your caregiver for further examination, therapy, or testing as directed. SEEK MEDICAL CARE IF:   You feel unwell or have chills.  You or your family notice a change in your behavior or your alertness.  You have trouble following your caregiver's treatment plan.  You have questions or concerns. SEEK IMMEDIATE MEDICAL CARE IF:   You have a rapid heartbeat or have chest pain.  You have difficulty breathing.  You have a fever.  You have a headache with a stiff neck.  You cough up blood.  You have blood in your urine or stool.  You have severe agitation or confusion. MAKE SURE YOU:   Understand these instructions.  Will watch your condition.  Will get help right away if you are not doing well or get worse. Document Released: 12/12/2009 Document Revised: 09/16/2011 Document Reviewed: 12/12/2009 [facility] Patient Information 2015 [place]. This information is not intended to replace advice given to you by your health care provider. Make sure you discuss any questions you have with your health care provider.  Not every illness or injury can be identified during an emergency department visit, thus follow-up with your primary healthcare provider is important. Medical conditions can also worsen, so it is also important to return immediately as directed below, or if you have other serious concerns develop. RETURN IMMEDIATELY IF you develop new shortness of breath, chest pain, fever, have difficulty moving parts of your body (new weakness, numbness, or incoordination), sudden change in speech, vision, swallowing, or understanding, faint or develop new dizziness, severe headache, become  poorly responsive or have an altered mental status compared to baseline for you, new rash, abdominal pain, or bloody stools,    °Return sooner also if you develop new problems for which you have not talked to your caregiver but you feel may be emergency medical conditions, or are unable to be cared for safely at home. ° °

## 2014-02-05 NOTE — ED Provider Notes (Signed)
CSN: [id number]     Arrival date & time 02/05/14  1316 History   First MD Initiated Contact with Patient 02/05/14 1557    This chart was scribed for No att. providers found by Marica OtterNusrat Rahman, ED Scribe. This patient was seen in room MH03/MH03 and the patient's care was started at 3:58 PM.  Chief Complaint  Patient presents with  . Urinary Tract Infection   The history is provided by a caregiver. No language interpreter was used.   HPI Comments: Jesus Estrada is a 73 y.o. male, with a Hx of kidney transplant in 2008, high BP, DM, BKA, and confusion with UTIs,  who presents to the Emergency Department complaining of an UTI with associated confusion onset 3 days ago. Caretaker states that she knew pt possibly had an UTI because his urine was cloudy and odorous. Additionally, caretaker notes that pt's glucose levels are always elevated with his UTI's. Caretaker specifies that at baseline pt is not confused and is oriented x3. Caretaker denies n/v/d, fever, chills. Caretaker reports that he missed his dose of insulin today.   Past Medical History  Diagnosis Date  . Diabetes mellitus without complication   . Renal disorder   . Hypertension    Past Surgical History  Procedure Laterality Date  . Nephrectomy transplanted organ      2008  . Below knee leg amputation Bilateral    No family history on file. History  Substance Use Topics  . Smoking status: Current Every Day Smoker  . Smokeless tobacco: Not on file  . Alcohol Use: No    Review of Systems  10 Systems reviewed and are negative for acute change except as noted in the HPI.   Allergies  Review of patient's allergies indicates no known allergies.  Home Medications   Prior to Admission medications   Medication Sig Start Date End Date Taking? Authorizing Provider  cholecalciferol (D-VI-SOL) 400 UNIT/ML LIQD Take 400 Units by mouth daily.   Yes Historical Provider, MD  gabapentin (NEURONTIN) 300 MG capsule Take 300 mg by mouth 3  (three) times daily.   Yes Historical Provider, MD  metoprolol succinate (TOPROL-XL) 50 MG 24 hr tablet Take 50 mg by mouth daily. Take with or immediately following a meal.   Yes Historical Provider, MD  mycophenolate (CELLCEPT) 250 MG capsule Take 250 mg by mouth 2 (two) times daily.   Yes Historical Provider, MD  NIFEdipine (PROCARDIA-XL/ADALAT CC) 60 MG 24 hr tablet Take 60 mg by mouth daily.   Yes Historical Provider, MD  pravastatin (PRAVACHOL) 40 MG tablet Take 40 mg by mouth daily.   Yes Historical Provider, MD  predniSONE (DELTASONE) 5 MG tablet Take 5 mg by mouth daily with breakfast.   Yes Historical Provider, MD  tacrolimus (PROGRAF) 1 MG capsule Take 1 mg by mouth 2 (two) times daily.   Yes Historical Provider, MD  cephALEXin (KEFLEX) 500 MG capsule 2 caps po bid x 7 days 02/05/14   Hurman HornJohn M Royale Lennartz, MD   Triage Vitals: BP 141/75  Pulse 81  Temp(Src) 99.3 F (37.4 C) (Oral)  Resp 24  Ht 5\' 11"  (1.803 m)  Wt 250 lb (113.399 kg)  BMI 34.88 kg/m2  SpO2 98% Physical Exam  Nursing note and vitals reviewed. Constitutional: He is oriented to person, place, and time.  Awake, alert, nontoxic appearance.  HENT:  Head: Atraumatic.  Eyes: Right eye exhibits no discharge. Left eye exhibits no discharge.  Neck: Neck supple.  Cardiovascular: Normal rate and  regular rhythm.   No murmur heard. Pulmonary/Chest: Effort normal and breath sounds normal. He exhibits no tenderness.  Abdominal: Soft. Bowel sounds are normal. There is no tenderness. There is no rebound.  Musculoskeletal: He exhibits no tenderness.  Baseline ROM, no obvious new focal weakness. BKA  Neurological: He is alert and oriented to person, place, and time.  Mental status and motor strength appears baseline for patient and situation.  Skin: No rash noted.  Psychiatric: He has a normal mood and affect.    ED Course  Procedures (including critical care time)  Patient / Family / Caregiver informed of clinical course,  understand medical decision-making process, and agree with plan.  DIAGNOSTIC STUDIES: Oxygen Saturation is 98% on RA, nl by my interpretation.    COORDINATION OF CARE: 4:04 PM-Discussed treatment plan which includes meds with pt at bedside and pt agreed to plan.  D/w Transplant team rec f/u with PCP or nephrologist. Caregiver and Pt want discharge. Labs Review Labs Reviewed  URINALYSIS, ROUTINE W REFLEX MICROSCOPIC - Abnormal; Notable for the following:    APPearance TURBID (*)    Hgb urine dipstick LARGE (*)    Protein, ur 100 (*)    Leukocytes, UA LARGE (*)    All other components within normal limits  URINE MICROSCOPIC-ADD ON - Abnormal; Notable for the following:    Bacteria, UA MANY (*)    All other components within normal limits  CBC WITH DIFFERENTIAL - Abnormal; Notable for the following:    MCV 76.2 (*)    MCH 24.6 (*)    RDW 16.3 (*)    Monocytes Relative 13 (*)    Monocytes Absolute 1.1 (*)    All other components within normal limits  COMPREHENSIVE METABOLIC PANEL - Abnormal; Notable for the following:    CO2 18 (*)    Glucose, Bld 258 (*)    BUN 40 (*)    Creatinine, Ser 1.40 (*)    Albumin 2.7 (*)    GFR calc non Af Amer 49 (*)    GFR calc Af Amer 57 (*)    All other components within normal limits  CBG MONITORING, ED - Abnormal; Notable for the following:    Glucose-Capillary 278 (*)    All other components within normal limits  CBG MONITORING, ED - Abnormal; Notable for the following:    Glucose-Capillary 107 (*)    All other components within normal limits  URINE CULTURE  I-STAT CG4 LACTIC ACID, ED    Imaging Review No results found.   EKG Interpretation None      MDM   Final diagnoses:  Urinary tract infection without hematuria, site unspecified  Altered mental status, unspecified altered mental status type  Hyperglycemia  Renal insufficiency   [facility] transplant team okay with discharge. I doubt any other EMC precluding discharge at  this time including, but not necessarily limited to the following:sepsis. I personally performed the services described in this documentation, which was scribed in my presence. The recorded information has been reviewed and is accurate.     Hurman Horn, MD 02/07/14 [phone]

## 2014-02-05 NOTE — ED Notes (Signed)
Pt here with family member who takes care of his medical needs. She states that the patient is having UTI symptoms as well increased confusion. She states that whenever he has a UTI it is typical for him to become confused. Patient is sitting in a wheelchair in triage, but not answering any questions.

## 2014-02-05 NOTE — ED Notes (Addendum)
4 sticks for IV .Dr Fonnie JarvisBednar made aware

## 2014-02-08 ENCOUNTER — Telehealth (HOSPITAL_BASED_OUTPATIENT_CLINIC_OR_DEPARTMENT_OTHER): Payer: Self-pay | Admitting: Emergency Medicine

## 2014-02-08 LAB — URINE CULTURE

## 2014-02-09 ENCOUNTER — Telehealth (HOSPITAL_COMMUNITY): Payer: Self-pay

## 2014-02-09 NOTE — ED Notes (Signed)
Post ED Visit - Positive Culture Follow-up: Successful Patient Follow-Up  Culture assessed and recommendations reviewed by: []  Wes Dulaney, Pharm.D., BCPS []  Celedonio MiyamotoJeremy Frens, Pharm.D., BCPS [x]  Georgina PillionElizabeth Martin, [address]Ph[address]D., BCPS []  ElginMinh Pham, [address]Pharm[address]D., BCPS, AAHIVP []  Estella HuskMichelle Turner, Pharm.D., BCPS, AAHIVP []  Red ChristiansSamson Lee, Pharm.D. []  Tennis Mustassie Stewart, Pharm.D.  Positive urine, ESBL culture  []  Patient discharged without antimicrobial prescription and treatment is now indicated [x]  Organism is resistant to prescribed ED discharge antimicrobial []  Patient with positive blood cultures  Changes discussed with ED provider: Emily FilbertKaitlynn Szekalski PA  New antibiotic prescription must return to ED. Resistant to all oral antibiotics Called to none  Contacted patient, date 02/09/2014, time 1010-    Jesus JacobsFesterman, Jesus Estrada 02/09/2014, 10:09 AM

## 2014-02-09 NOTE — Progress Notes (Signed)
ED Antimicrobial Stewardship Positive Culture Follow Up   Jesus Estrada is an 73 y.o. male [facility] with a chief complaint of AMS, UTI sx  Chief Complaint  Patient presents with  . Urinary Tract Infection    Recent Results (from the past 720 hour(s))  URINE CULTURE     Status: None   Collection Time    02/05/14  4:48 PM      Result Value Ref Range Status   Specimen Description URINE, CLEAN CATCH   Final   Special Requests NONE   Final   Culture  Setup Time     Final   Value: 02/05/2014 19:39     [facility]   Colony Count     Final   Value: >=100,000 COLONIES/ML     [facility]   Culture     Final   Value: ESCHERICHIA COLI     Note: Confirmed Extended Spectrum Beta-Lactamase Producer (ESBL) CRITICAL RESULT CALLED TO, READ BACK BY AND VERIFIED WITH: LYNN M. AT 1:35PM ON 02/08/2014 BY HAJAM     [facility] at [facility]   Report Status 02/08/2014 FINAL   Final   Organism ID, Bacteria ESCHERICHIA COLI   Final    [x]  Treated with Keflex, organism resistant to prescribed antimicrobial  73 YOM with hx renal transplant '08 [facility] s/sx of UTI, including AMS. UCx have now resulted as ESBL E.coli - given the patient's history of transplant would recommend [facility].   New antibiotic prescription: [facility]  ED Provider: Emilia BeckKaitlyn Szekalski, PA-C   Rolley SimsMartin, Thera Basden Ann 02/09/2014, 9:11 AM Infectious Diseases Pharmacist Phone# [phone]

## 2014-12-07 DEATH — deceased
